# Patient Record
Sex: Female | Born: 1970 | Race: Black or African American | Hispanic: No | Marital: Married | Smoking: Never smoker
Health system: Southern US, Community
[De-identification: ages and names within clinical notes are randomized; demographics above are authoritative.]

## PROBLEM LIST (undated history)

## (undated) DIAGNOSIS — I10 Essential (primary) hypertension: Secondary | ICD-10-CM

## (undated) DIAGNOSIS — E119 Type 2 diabetes mellitus without complications: Secondary | ICD-10-CM

## (undated) DIAGNOSIS — E785 Hyperlipidemia, unspecified: Secondary | ICD-10-CM

## (undated) HISTORY — DX: Essential (primary) hypertension: I10

## (undated) HISTORY — PX: WISDOM TOOTH EXTRACTION: SHX21

## (undated) HISTORY — DX: Type 2 diabetes mellitus without complications: E11.9

## (undated) HISTORY — DX: Hyperlipidemia, unspecified: E78.5

## (undated) HISTORY — PX: HYSTEROSCOPY WITH D & C: SHX1775

---

## 1998-01-12 ENCOUNTER — Other Ambulatory Visit: Admission: RE | Admit: 1998-01-12 | Discharge: 1998-01-12 | Payer: Self-pay | Admitting: Family Medicine

## 2001-09-13 ENCOUNTER — Other Ambulatory Visit: Admission: RE | Admit: 2001-09-13 | Discharge: 2001-09-13 | Payer: Self-pay | Admitting: Obstetrics and Gynecology

## 2001-12-10 ENCOUNTER — Ambulatory Visit (HOSPITAL_COMMUNITY): Admission: RE | Admit: 2001-12-10 | Discharge: 2001-12-10 | Payer: Self-pay | Admitting: Obstetrics and Gynecology

## 2001-12-10 ENCOUNTER — Encounter: Payer: Self-pay | Admitting: Obstetrics and Gynecology

## 2002-10-24 ENCOUNTER — Ambulatory Visit (HOSPITAL_COMMUNITY): Admission: RE | Admit: 2002-10-24 | Discharge: 2002-10-24 | Payer: Self-pay | Admitting: Oncology

## 2004-05-20 ENCOUNTER — Other Ambulatory Visit: Admission: RE | Admit: 2004-05-20 | Discharge: 2004-05-20 | Payer: Self-pay | Admitting: Obstetrics and Gynecology

## 2004-11-03 ENCOUNTER — Ambulatory Visit (HOSPITAL_COMMUNITY): Admission: RE | Admit: 2004-11-03 | Discharge: 2004-11-03 | Payer: Self-pay | Admitting: Obstetrics and Gynecology

## 2005-02-08 ENCOUNTER — Emergency Department (HOSPITAL_COMMUNITY): Admission: EM | Admit: 2005-02-08 | Discharge: 2005-02-08 | Payer: Self-pay | Admitting: Family Medicine

## 2005-10-26 ENCOUNTER — Emergency Department (HOSPITAL_COMMUNITY): Admission: EM | Admit: 2005-10-26 | Discharge: 2005-10-26 | Payer: Self-pay | Admitting: Emergency Medicine

## 2007-06-04 ENCOUNTER — Inpatient Hospital Stay (HOSPITAL_COMMUNITY): Admission: AD | Admit: 2007-06-04 | Discharge: 2007-06-04 | Payer: Self-pay | Admitting: Obstetrics and Gynecology

## 2008-11-29 ENCOUNTER — Ambulatory Visit (HOSPITAL_COMMUNITY): Admission: RE | Admit: 2008-11-29 | Discharge: 2008-11-29 | Payer: Self-pay | Admitting: Obstetrics and Gynecology

## 2009-12-26 ENCOUNTER — Ambulatory Visit: Payer: Self-pay | Admitting: Obstetrics and Gynecology

## 2009-12-26 ENCOUNTER — Inpatient Hospital Stay (HOSPITAL_COMMUNITY): Admission: AD | Admit: 2009-12-26 | Discharge: 2009-12-26 | Payer: Self-pay | Admitting: Obstetrics and Gynecology

## 2010-08-07 ENCOUNTER — Encounter: Payer: Self-pay | Admitting: Obstetrics and Gynecology

## 2010-10-03 LAB — URINE MICROSCOPIC-ADD ON

## 2010-10-03 LAB — CBC
HCT: 28.5 % — ABNORMAL LOW (ref 36.0–46.0)
MCV: 62.8 fL — ABNORMAL LOW (ref 78.0–100.0)
Platelets: 326 10*3/uL (ref 150–400)

## 2010-10-03 LAB — WET PREP, GENITAL
Trich, Wet Prep: NONE SEEN
Yeast Wet Prep HPF POC: NONE SEEN

## 2010-10-03 LAB — URINALYSIS, ROUTINE W REFLEX MICROSCOPIC
Bilirubin Urine: NEGATIVE
Glucose, UA: NEGATIVE mg/dL
Nitrite: NEGATIVE
Urobilinogen, UA: 1 mg/dL (ref 0.0–1.0)

## 2010-10-03 LAB — POCT PREGNANCY, URINE: Preg Test, Ur: NEGATIVE

## 2011-04-25 LAB — CBC
HCT: 27.9 — ABNORMAL LOW
Hemoglobin: 8.7 — ABNORMAL LOW
MCHC: 31.3
MCV: 62.8 — ABNORMAL LOW
RBC: 4.44
RDW: 18.9 — ABNORMAL HIGH
WBC: 4.7

## 2011-04-25 LAB — DIFFERENTIAL
Basophils Absolute: 0.1
Basophils Relative: 3 — ABNORMAL HIGH
Eosinophils Absolute: 0.1 — ABNORMAL LOW
Eosinophils Relative: 1
Lymphocytes Relative: 40
Lymphs Abs: 1.9
Monocytes Absolute: 0.2
Neutro Abs: 2.4

## 2011-07-18 HISTORY — PX: REDUCTION MAMMAPLASTY: SUR839

## 2020-09-07 ENCOUNTER — Telehealth: Payer: Self-pay

## 2020-09-07 NOTE — Telephone Encounter (Signed)
Please advise. Chart is blank with information

## 2020-09-07 NOTE — Telephone Encounter (Signed)
Patient states her cousin Hinda Glatter sees Dr Etter Sjogren as PCP and she would like to establish care as well.   Please advise

## 2020-09-08 NOTE — Telephone Encounter (Signed)
I cant find anita neal in the system Did she spell it correctly

## 2020-10-10 NOTE — Progress Notes (Signed)
New Patient Office Visit  Subjective:  Patient ID: Ana Jennings, female    DOB: Jun 11, 1971  Age: 50 y.o. MRN: [id number]  CC:  Chief Complaint  Patient presents with  . Establish Care    HPI Ana Jennings presents to establish care.   Type 2 diabetes-diagnosed with diabetes approximately 3 years ago.  Recently she was reading some information regarding diabetes and the disease process.  Once she read what can happen in the setting of poorly controlled diabetes, she became very concerned and decided to start some lifestyle changes.  She has changed her diet and started going to the gym 3 times weekly.  She is doing intermittent fasting.  Avoids a majority of meats but does love fish and various nuts.  She is currently taking Januvia, Jardiance, and glimepiride.  She did try Metformin in the past but had difficulty tolerating it.  Despite that, she expresses the desire to retry Metformin since she has heard that it is honestly the best medication for her to be on.  She is interested in going to see a nutritionist to help her better manage her diabetes.  She has not been checking her sugars regularly but does have a glucometer available.  Elevated blood pressure-blood pressure has been elevated recently although she does not check it at home.  Notes her blood pressure has always been normal but in the last 6 to 8 months, her diet and lifestyle have changed and her blood pressure has started running higher.  She has never been treated with antihypertensives before.  Facial eczema-she has facial eczema that affects her periorbital area.  Has hydrocortisone 2.5% cream that she uses sparingly as needed.  Notes this does help and she would like a refill.  She does have an appointment with a dermatologist but due to demand, her appointment is not until 2023.  Would like a referral to see if there is any when she can see sooner in her area.  Trouble sleeping-has difficulty with sleep maintenance.  She is  able to fall asleep without difficulty but once she wakes between 2 4 AM on most nights, she is unable to get back to sleep.  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  History of IDA-has had iron deficiency anemia in the past that required IV iron infusions.  She was previously seeing an oncologist for this before her she relocated from [place].  Menopause-cannot remember her last menstrual period but it has been at least 12 months.  Wonders if some of her fatigue and difficulty sleeping is not related to menopause.  Would like to have a referral to OB/GYN for her women's health needs.  Past Medical History:  Diagnosis Date  . Diabetes mellitus without complication [facility])    Past Surgical History:  Procedure Laterality Date  . NO PAST SURGERIES      Family History  Problem Relation Age of Onset  . Diabetes Mother   . Diabetes Father     Social History   Socioeconomic History  . Marital status: Not on file    Spouse name: Not on file  . Number of children: Not on file  . Years of education: Not on file  . Highest education level: Not on file  Occupational History  . Not on file  Tobacco Use  . Smoking status: Never Smoker  .  Smokeless tobacco: Never Used  Vaping Use  . Vaping Use: Never used  Substance and Sexual Activity  . Alcohol use: Not Currently  . Drug use: Never  . Sexual activity: Yes    Birth control/protection: None  Other Topics Concern  . Not on file  Social History Narrative  . Not on file   Social Determinants of Health   Financial Resource Strain: Not on file  Food Insecurity: Not on file  Transportation Needs: Not on file  Physical Activity: Not on file  Stress: Not on file  Social Connections: Not on file  Intimate Partner Violence: Not on file    ROS Review of Systems  Constitutional: Positive for fatigue. Negative for  chills, fever and unexpected weight change.  Respiratory: Negative for cough, chest tightness and shortness of breath.   Cardiovascular: Negative for chest pain, palpitations and leg swelling.  Gastrointestinal: Negative for abdominal pain, constipation, diarrhea, nausea and vomiting.  Skin: Positive for rash.  Psychiatric/Behavioral: Positive for sleep disturbance. Negative for self-injury and suicidal ideas. The patient is not nervous/anxious.     Objective:   Today's Vitals: BP (!) 155/99   Pulse 89   Temp 98.4 F (36.9 C)   Ht 5' 5.25" (1.657 m)   Wt 191 lb 14.4 oz (87 kg)   SpO2 100%   BMI 31.69 kg/m   Physical Exam Vitals reviewed.  Constitutional:      General: She is not in acute distress.    Appearance: Normal appearance.  HENT:     Head: Normocephalic and atraumatic.  Cardiovascular:     Rate and Rhythm: Normal rate and regular rhythm.     Pulses: Normal pulses.     Heart sounds: Normal heart sounds. No murmur heard. No friction rub. No gallop.   Pulmonary:     Effort: Pulmonary effort is normal. No respiratory distress.     Breath sounds: Normal breath sounds. No wheezing.  Skin:    General: Skin is warm and dry.  Neurological:     Mental Status: She is alert and oriented to person, place, and time.  Psychiatric:        Mood and Affect: Mood normal.        Behavior: Behavior normal.        Thought Content: Thought content normal.        Judgment: Judgment normal.     Assessment & Plan:   1. Encounter to establish care Reviewed available information and discussed healthcare concerns with patient.  2. Type 2 diabetes mellitus with hyperglycemia, without long-term current use of insulin (HCC) Checking hemoglobin A1c.  Referring to nutrition and diabetes services at patient request.  Continue current regimen of Januvia, Jardiance, and glimepiride. - Lipid panel - Hemoglobin A1c - Referral to Nutrition and Diabetes Services  3. Hypertension associated  with diabetes ([place]) Checking CBC with differential and CMP.  Discussed recommendation for blood pressure management with a goal of 130/80 or less.  Would be beneficial to have her on an ACE/ARB per diabetes guidelines so would like to start valsartan.  She is hesitant to add more medications to her daily regimen so we will give her another 2 weeks of diet and exercise modifications and if still elevated, plan for starting medications. - CBC with Differential/Platelet - COMPLETE METABOLIC PANEL WITH GFR  4. Facial eczema Referring to dermatology per patient request.  Refilling hydrocortisone 2.5% twice daily as needed.  5. History of iron deficiency Checking iron panel. - Fe+TIBC+Fer  6. Trouble in sleeping Discussed options for sleep management. She is hesitant to add more medications so advised to look into guided imagery, sleep hygiene measures, and OTC/herbal options. If these are not helpful, we can certainly look at pharmaceutical options.   7. Well woman exam Referring to OBGYN per patient request. - Ambulatory referral to Obstetrics / Gynecology  8. Screening for endocrine disorder Checking TSH.  - TSH   Outpatient Encounter Medications as of 10/11/2020  Medication Sig  . [DISCONTINUED] empagliflozin (JARDIANCE) 25 MG TABS tablet Take 25 mg by mouth daily.  . [DISCONTINUED] glimepiride (AMARYL) 4 MG tablet Take 4 mg by mouth daily.  . [DISCONTINUED] sitaGLIPtin (JANUVIA) 100 MG tablet Take 1 tablet by mouth daily.  . empagliflozin (JARDIANCE) 25 MG TABS tablet Take 1 tablet (25 mg total) by mouth daily.  Marland Kitchen glimepiride (AMARYL) 4 MG tablet Take 1 tablet (4 mg total) by mouth daily.  . sitaGLIPtin (JANUVIA) 100 MG tablet Take 1 tablet (100 mg total) by mouth daily.   No facility-administered encounter medications on file as of 10/11/2020.    Follow-up: Return in about 2 weeks (around 10/25/2020) for nurse visit for BP check.   Clearnce Sorrel, DNP, APRN, FNP-BC [facility]

## 2020-10-11 ENCOUNTER — Encounter: Payer: Self-pay | Admitting: Medical-Surgical

## 2020-10-11 ENCOUNTER — Ambulatory Visit (INDEPENDENT_AMBULATORY_CARE_PROVIDER_SITE_OTHER): Payer: BC Managed Care – PPO | Admitting: Medical-Surgical

## 2020-10-11 ENCOUNTER — Other Ambulatory Visit: Payer: Self-pay

## 2020-10-11 VITALS — BP 155/99 | HR 89 | Temp 98.4°F | Ht 65.25 in | Wt 191.9 lb

## 2020-10-11 DIAGNOSIS — E1169 Type 2 diabetes mellitus with other specified complication: Secondary | ICD-10-CM

## 2020-10-11 DIAGNOSIS — L309 Dermatitis, unspecified: Secondary | ICD-10-CM

## 2020-10-11 DIAGNOSIS — Z8639 Personal history of other endocrine, nutritional and metabolic disease: Secondary | ICD-10-CM | POA: Insufficient documentation

## 2020-10-11 DIAGNOSIS — I152 Hypertension secondary to endocrine disorders: Secondary | ICD-10-CM

## 2020-10-11 DIAGNOSIS — Z7689 Persons encountering health services in other specified circumstances: Secondary | ICD-10-CM

## 2020-10-11 DIAGNOSIS — E1165 Type 2 diabetes mellitus with hyperglycemia: Secondary | ICD-10-CM | POA: Insufficient documentation

## 2020-10-11 DIAGNOSIS — G479 Sleep disorder, unspecified: Secondary | ICD-10-CM

## 2020-10-11 DIAGNOSIS — E1159 Type 2 diabetes mellitus with other circulatory complications: Secondary | ICD-10-CM

## 2020-10-11 DIAGNOSIS — Z01419 Encounter for gynecological examination (general) (routine) without abnormal findings: Secondary | ICD-10-CM

## 2020-10-11 DIAGNOSIS — E785 Hyperlipidemia, unspecified: Secondary | ICD-10-CM

## 2020-10-11 DIAGNOSIS — Z1329 Encounter for screening for other suspected endocrine disorder: Secondary | ICD-10-CM

## 2020-10-11 MED ORDER — HYDROCORTISONE 2.5 % EX CREA: TOPICAL_CREAM | Freq: Two times a day (BID) | CUTANEOUS | 0 refills | Status: DC | PRN

## 2020-10-11 MED ORDER — GLIMEPIRIDE 4 MG PO TABS: 4.0000 mg | ORAL_TABLET | Freq: Every day | ORAL | 1 refills | Status: DC

## 2020-10-11 MED ORDER — EMPAGLIFLOZIN 25 MG PO TABS: 25.0000 mg | ORAL_TABLET | Freq: Every day | ORAL | 1 refills | Status: DC

## 2020-10-11 MED ORDER — SITAGLIPTIN PHOSPHATE 100 MG PO TABS: 100.0000 mg | ORAL_TABLET | Freq: Every day | ORAL | 1 refills | Status: DC

## 2020-10-11 NOTE — Patient Instructions (Signed)

## 2020-10-12 LAB — HEMOGLOBIN A1C
Hgb A1c MFr Bld: 9.8 % of total Hgb — ABNORMAL HIGH (ref <5.7)
Mean Plasma Glucose: 235 mg/dL
eAG (mmol/L): 13 mmol/L

## 2020-10-12 LAB — COMPLETE METABOLIC PANEL WITH GFR
AG Ratio: 1.5 (calc) (ref 1.0–2.5)
ALT: 25 U/L (ref 6–29)
AST: 17 U/L (ref 10–35)
Albumin: 4.6 g/dL (ref 3.6–5.1)
Alkaline phosphatase (APISO): 68 U/L (ref 37–153)
BUN: 14 mg/dL (ref 7–25)
CO2: 26 mmol/L (ref 20–32)
Calcium: 9.4 mg/dL (ref 8.6–10.4)
Chloride: 103 mmol/L (ref 98–110)
Creat: 0.53 mg/dL (ref 0.50–1.05)
GFR, Est African American: 127 mL/min/{1.73_m2} (ref >=60)
GFR, Est Non African American: 110 mL/min/{1.73_m2} (ref >=60)
Globulin: 3 g/dL (calc) (ref 1.9–3.7)
Glucose, Bld: 233 mg/dL — ABNORMAL HIGH (ref 65–99)
Potassium: 4.2 mmol/L (ref 3.5–5.3)
Sodium: 139 mmol/L (ref 135–146)
Total Bilirubin: 0.5 mg/dL (ref 0.2–1.2)
Total Protein: 7.6 g/dL (ref 6.1–8.1)

## 2020-10-12 LAB — CBC WITH DIFFERENTIAL/PLATELET
Absolute Monocytes: 262 cells/uL (ref 200–950)
Basophils Absolute: 41 cells/uL (ref 0–200)
Basophils Relative: 1 %
Eosinophils Absolute: 70 cells/uL (ref 15–500)
Eosinophils Relative: 1.7 %
HCT: 46.1 % — ABNORMAL HIGH (ref 35.0–45.0)
Hemoglobin: 15.1 g/dL (ref 11.7–15.5)
Lymphs Abs: 1472 cells/uL (ref 850–3900)
MCH: 27.1 pg (ref 27.0–33.0)
MCHC: 32.8 g/dL (ref 32.0–36.0)
MCV: 82.6 fL (ref 80.0–100.0)
MPV: 11 fL (ref 7.5–12.5)
Monocytes Relative: 6.4 %
Neutro Abs: 2255 cells/uL (ref 1500–7800)
Neutrophils Relative %: 55 %
Platelets: 233 10*3/uL (ref 140–400)
RBC: 5.58 10*6/uL — ABNORMAL HIGH (ref 3.80–5.10)
RDW: 13.3 % (ref 11.0–15.0)
Total Lymphocyte: 35.9 %
WBC: 4.1 10*3/uL (ref 3.8–10.8)

## 2020-10-12 LAB — TSH: TSH: 0.52 mIU/L

## 2020-10-12 LAB — IRON,TIBC AND FERRITIN PANEL
%SAT: 15 % (calc) — ABNORMAL LOW (ref 16–45)
Ferritin: 68 ng/mL (ref 16–232)
Iron: 60 ug/dL (ref 45–160)
TIBC: 405 mcg/dL (calc) (ref 250–450)

## 2020-10-12 LAB — LIPID PANEL
Cholesterol: 181 mg/dL (ref <200)
HDL: 48 mg/dL — ABNORMAL LOW (ref >=50)
LDL Cholesterol (Calc): 116 mg/dL (calc) — ABNORMAL HIGH
Non-HDL Cholesterol (Calc): 133 mg/dL (calc) — ABNORMAL HIGH (ref <130)
Total CHOL/HDL Ratio: 3.8 (calc) (ref <5.0)
Triglycerides: 78 mg/dL (ref <150)

## 2020-10-12 MED ORDER — ATORVASTATIN CALCIUM 10 MG PO TABS: 10.0000 mg | ORAL_TABLET | Freq: Every day | ORAL | 3 refills | Status: DC

## 2020-10-12 NOTE — Addendum Note (Signed)
Addended bySamuel Bouche on: 10/12/2020 06:26 PM   Modules accepted: Orders

## 2020-10-12 NOTE — Addendum Note (Signed)
Addended bySamuel Bouche on: 10/12/2020 06:24 PM   Modules accepted: Orders

## 2020-10-15 ENCOUNTER — Other Ambulatory Visit: Payer: Self-pay | Admitting: Medical-Surgical

## 2020-10-15 ENCOUNTER — Encounter: Payer: Self-pay | Admitting: Medical-Surgical

## 2020-10-15 MED ORDER — HYDROCORTISONE 2.5 % EX OINT: TOPICAL_OINTMENT | CUTANEOUS | 3 refills | Status: DC

## 2020-10-19 ENCOUNTER — Emergency Department (HOSPITAL_BASED_OUTPATIENT_CLINIC_OR_DEPARTMENT_OTHER)
Admission: EM | Admit: 2020-10-19 | Discharge: 2020-10-19 | Disposition: A | Discharge status: Home / Self Care | Payer: BC Managed Care – PPO | Attending: Emergency Medicine | Admitting: Emergency Medicine

## 2020-10-19 ENCOUNTER — Other Ambulatory Visit: Payer: Self-pay

## 2020-10-19 ENCOUNTER — Encounter (HOSPITAL_BASED_OUTPATIENT_CLINIC_OR_DEPARTMENT_OTHER): Payer: Self-pay | Admitting: *Deleted

## 2020-10-19 DIAGNOSIS — Z7984 Long term (current) use of oral hypoglycemic drugs: Secondary | ICD-10-CM | POA: Diagnosis not present

## 2020-10-19 DIAGNOSIS — E119 Type 2 diabetes mellitus without complications: Secondary | ICD-10-CM | POA: Diagnosis not present

## 2020-10-19 DIAGNOSIS — Z79899 Other long term (current) drug therapy: Secondary | ICD-10-CM | POA: Diagnosis not present

## 2020-10-19 DIAGNOSIS — M7522 Bicipital tendinitis, left shoulder: Secondary | ICD-10-CM

## 2020-10-19 DIAGNOSIS — I10 Essential (primary) hypertension: Secondary | ICD-10-CM | POA: Insufficient documentation

## 2020-10-19 DIAGNOSIS — M25512 Pain in left shoulder: Secondary | ICD-10-CM | POA: Diagnosis present

## 2020-10-19 MED ORDER — NAPROXEN 375 MG PO TABS: 375.0000 mg | ORAL_TABLET | Freq: Two times a day (BID) | ORAL | 0 refills | Status: DC

## 2020-10-19 MED ORDER — METHOCARBAMOL 500 MG PO TABS: 500.0000 mg | ORAL_TABLET | Freq: Three times a day (TID) | ORAL | 0 refills | Status: DC | PRN

## 2020-10-19 NOTE — ED Triage Notes (Signed)
Left arm pain x 2 days. States she feels like she slept on it.

## 2020-10-19 NOTE — Discharge Instructions (Signed)
1.  You may take naproxen as prescribed twice daily.  You may also take extra strength Tylenol every 6 hours either alone or in combination with naproxen.  You have been prescribed Robaxin.  This is a muscle relaxer.  You may try this to see if it is helpful in combination with either naproxen or Tylenol. 2.  Initially rest the arm.  After 2 to 3 days of anti-inflammatories, start gentle movements of the arms of the shoulder does not stiffen.  Follow-up with your doctor for recheck.  You may need referral to an orthopedic specialist for shoulder pain if symptoms are persisting.

## 2020-10-19 NOTE — ED Provider Notes (Signed)
[place] EMERGENCY DEPARTMENT Provider Note   CSN: [id number] Arrival date & time: 10/19/20  1657     History Chief Complaint  Patient presents with  . Arm Pain    Ana Jennings is a 50 y.o. female.  HPI Patient reports having a lot of pain into the left shoulder and upper arm.  Is radiating down from the shoulder into the biceps area.  She reports she started googling and then got concerned about heart attack.  She reports however, after thinking about it, 2 days ago she was working on the elliptical treadmill.  She reports she was doing a really aggressive exercise and using her upper arms briskly.  She did a really successful workout and then did a second work out on the elliptical,  just to confirm how well it went.  She reports she worked up a really good sweat.  She did not experience any chest pain or unusual shortness of breath during these exercise regimens.  She reports she gets a lot of pain now if she elevates the left arm at the shoulder.  No numbness weakness or tingling into the hand.  She has not tried anything for pain yet.    Past Medical History:  Diagnosis Date  . Diabetes mellitus without complication [facility])     Patient Active Problem List   Diagnosis Date Noted  . Facial eczema 10/11/2020  . Hypertension associated with diabetes ([place]) 10/11/2020  . Type 2 diabetes mellitus with hyperglycemia, without long-term current use of insulin ([place]) 10/11/2020  . History of iron deficiency 10/11/2020  . Trouble in sleeping 10/11/2020    Past Surgical History:  Procedure Laterality Date  . NO PAST SURGERIES       OB History   No obstetric history on file.     Family History  Problem Relation Age of Onset  . Diabetes Mother   . Diabetes Father     Social History   Tobacco Use  . Smoking status: Never Smoker  . Smokeless tobacco: Never Used  Vaping Use  . Vaping Use: Never used  Substance Use Topics  . Alcohol use: Not Currently  . Drug use:  Never    Home Medications Prior to Admission medications   Medication Sig Start Date End Date Taking? Authorizing Provider  atorvastatin (LIPITOR) 10 MG tablet Take 1 tablet (10 mg total) by mouth daily. 10/12/20  Yes Samuel Bouche, NP  empagliflozin (JARDIANCE) 25 MG TABS tablet Take 1 tablet (25 mg total) by mouth daily. 10/11/20  Yes Samuel Bouche, NP  glimepiride (AMARYL) 4 MG tablet Take 1 tablet (4 mg total) by mouth daily. 10/11/20  Yes Samuel Bouche, NP  hydrocortisone 2.5 % ointment Apply to affected area BID. 10/15/20   Samuel Bouche, NP  methocarbamol (ROBAXIN) 500 MG tablet Take 1 tablet (500 mg total) by mouth every 8 (eight) hours as needed for muscle spasms. 10/19/20  Yes Charlesetta Shanks, MD  naproxen (NAPROSYN) 375 MG tablet Take 1 tablet (375 mg total) by mouth 2 (two) times daily. 10/19/20  Yes Marlo Arriola, Jeannie Done, MD  sitaGLIPtin (JANUVIA) 100 MG tablet Take 1 tablet (100 mg total) by mouth daily. 10/11/20  Yes Samuel Bouche, NP    Allergies    Metformin and related  Review of Systems   Review of Systems 10 systems reviewed negative except as per HPI Physical Exam Updated Vital Signs BP (!) 146/88   Pulse 78   Temp 98.5 F (36.9 C) (Oral)   Resp 16  Ht 5' 5.25" (1.657 m)   Wt 89.3 kg   SpO2 98%   BMI 32.51 kg/m   Physical Exam Constitutional:      Appearance: Normal appearance.     Comments: Patient is well in appearance.  No respiratory distress.  HENT:     Mouth/Throat:     Pharynx: Oropharynx is clear.  Eyes:     Extraocular Movements: Extraocular movements intact.  Cardiovascular:     Rate and Rhythm: Normal rate and regular rhythm.  Pulmonary:     Effort: Pulmonary effort is normal.     Breath sounds: Normal breath sounds.  Abdominal:     General: There is no distension.     Palpations: Abdomen is soft.  Musculoskeletal:     Comments: Pain to palpation over the anterior aspect of the shoulder slightly distally consistent with biceps tendon location.  Pain with  range of motion with abduction of the shoulder beyond 60 degrees.  Biceps muscle is intact.  Hand is warm and dry.  Radial pulse 2+ and strong.  Grip strength is normal.  Skin:    General: Skin is warm and dry.  Neurological:     General: No focal deficit present.     Mental Status: She is alert and oriented to person, place, and time.     Coordination: Coordination normal.  Psychiatric:        Mood and Affect: Mood normal.     ED Results / Procedures / Treatments   Labs (all labs ordered are listed, but only abnormal results are displayed) Labs Reviewed - No data to display  EKG None  Radiology No results found.  Procedures Procedures   Medications Ordered in ED Medications - No data to display  ED Course  I have reviewed the triage vital signs and the nursing notes.  Pertinent labs & imaging results that were available during my care of the patient were reviewed by me and considered in my medical decision making (see chart for details).    MDM Rules/Calculators/A&P                          Patient presents with shoulder and upper arm pain with concern for possible cardiac etiology.  After getting complete history, she did have a significant workout and has findings consistent with biceps tendinitis.  Pain is very reproducible.  She has no neurovascular compromise.  Patient successfully did aggressive workout with no chest pain or shortness of breath.  I have extremely low suspicion for ACS.  Patient is counseled on using naproxen and acetaminophen for pain control.  Also counseled on Robaxin for muscle relaxer. Final Clinical Impression(s) / ED Diagnoses Final diagnoses:  Biceps tendonitis on left    Rx / DC Orders ED Discharge Orders         Ordered    naproxen (NAPROSYN) 375 MG tablet  2 times daily        10/19/20 1921    methocarbamol (ROBAXIN) 500 MG tablet  Every 8 hours PRN        10/19/20 Burton Apley, MD 10/19/20 1927

## 2020-10-19 NOTE — ED Notes (Signed)
Patient complaining of left arm pain. Pain began 2 days ago. Patient believes she "overdid it" at the gym on the elliptical machine. Pain radiates from mid deltoid area down to wrist.

## 2020-10-19 NOTE — ED Triage Notes (Signed)
Emergency Medicine Provider Triage Evaluation Note  Ana Jennings , a 50 y.o. female  was evaluated in triage.  Pt complains of L arm pain for the last two days. States that she slept on her arm two nights ago and started having pain in this area. Frequently goes to the gym. No cp, sob, parerethises, no cardiac history. No trauma to the area.    Review of Systems  Positive: L arm pain Negative: Numbness, tingling, CP, SOB, palpiations.   Physical Exam  BP (!) 143/88 (BP Location: Right Arm)   Pulse 87   Temp 98.5 F (36.9 C) (Oral)   Resp 16   Ht 5' 5.25" (1.657 m)   Wt 89.3 kg   SpO2 100%   BMI 32.51 kg/m  Gen:   Awake, no distress   HEENT:  Atraumatic  Resp:  Normal effort  Cardiac:  Normal rate Abd:   Nondistended, nontender MSK:   L arm with tenderness from biceps down to forearm. NO ttp to neck or shoulder. No obvious biceps deformity. Able to range arm slowly. No swelling or erythema. Radial pulse 2+ Neuro:  Speech clear  Medical Decision Making  Medically screening exam initiated at 5:14 PM.  Appropriate orders placed.  Ana Jennings was informed that the remainder of the evaluation will be completed by another provider, this initial triage assessment does not replace that evaluation, and the importance of remaining in the ED until their evaluation is complete.  Clinical Impression  L arm pain.    Alfredia Client, PA-C 10/19/20 1722

## 2020-10-25 ENCOUNTER — Ambulatory Visit (INDEPENDENT_AMBULATORY_CARE_PROVIDER_SITE_OTHER): Payer: BC Managed Care – PPO | Admitting: Medical-Surgical

## 2020-10-25 ENCOUNTER — Other Ambulatory Visit: Payer: Self-pay

## 2020-10-25 VITALS — BP 144/95 | HR 84 | Temp 98.8°F | Resp 20 | Ht 65.25 in | Wt 190.0 lb

## 2020-10-25 DIAGNOSIS — E1159 Type 2 diabetes mellitus with other circulatory complications: Secondary | ICD-10-CM

## 2020-10-25 DIAGNOSIS — I152 Hypertension secondary to endocrine disorders: Secondary | ICD-10-CM

## 2020-10-25 MED ORDER — FLUOCINOLONE ACETONIDE 0.01 % EX SOLN: Freq: Two times a day (BID) | CUTANEOUS | 0 refills | Status: DC

## 2020-10-25 MED ORDER — AMLODIPINE BESYLATE 5 MG PO TABS: 5.0000 mg | ORAL_TABLET | Freq: Every day | ORAL | 3 refills | Status: DC

## 2020-10-25 NOTE — Progress Notes (Signed)
Established Patient Office Visit  Subjective:  Patient ID: Ana Jennings, female    DOB: [birth year]  Age: 50 y.o. MRN: [id number]  CC:  Chief Complaint  Patient presents with  . Hypertension    HPI Ana Jennings presents for a BP check. First BP 156/99, Second BP 144/95.  Past Medical History:  Diagnosis Date  . Diabetes mellitus without complication [facility])     Past Surgical History:  Procedure Laterality Date  . NO PAST SURGERIES      Family History  Problem Relation Age of Onset  . Diabetes Mother   . Diabetes Father     Social History   Socioeconomic History  . Marital status: Married    Spouse name: Not on file  . Number of children: Not on file  . Years of education: Not on file  . Highest education level: Not on file  Occupational History  . Not on file  Tobacco Use  . Smoking status: Never Smoker  . Smokeless tobacco: Never Used  Vaping Use  . Vaping Use: Never used  Substance and Sexual Activity  . Alcohol use: Not Currently  . Drug use: Never  . Sexual activity: Yes    Birth control/protection: None  Other Topics Concern  . Not on file  Social History Narrative   ** Merged History Encounter **       Social Determinants of Health   Financial Resource Strain: Not on file  Food Insecurity: Not on file  Transportation Needs: Not on file  Physical Activity: Not on file  Stress: Not on file  Social Connections: Not on file  Intimate Partner Violence: Not on file    Outpatient Medications Prior to Visit  Medication Sig Dispense Refill  . atorvastatin (LIPITOR) 10 MG tablet Take 1 tablet (10 mg total) by mouth daily. 90 tablet 3  . empagliflozin (JARDIANCE) 25 MG TABS tablet Take 1 tablet (25 mg total) by mouth daily. 90 tablet 1  . glimepiride (AMARYL) 4 MG tablet Take 1 tablet (4 mg total) by mouth daily. 90 tablet 1  . hydrocortisone 2.5 % ointment Apply to affected area BID. 30 g 3  . methocarbamol (ROBAXIN) 500 MG tablet Take 1 tablet (500 mg total)  by mouth every 8 (eight) hours as needed for muscle spasms. 20 tablet 0  . naproxen (NAPROSYN) 375 MG tablet Take 1 tablet (375 mg total) by mouth 2 (two) times daily. 20 tablet 0  . sitaGLIPtin (JANUVIA) 100 MG tablet Take 1 tablet (100 mg total) by mouth daily. 90 tablet 1   No facility-administered medications prior to visit.    Allergies  Allergen Reactions  . Metformin And Related Nausea And Vomiting and Other (See Comments)    headache    ROS Review of Systems    Objective:    Physical Exam  BP (!) 156/99 (BP Location: Right Arm, Patient Position: Sitting, Cuff Size: Large)   Pulse 84   Temp 98.8 F (37.1 C) (Temporal)   Resp 20   Ht 5' 5.25" (1.657 m)   Wt 190 lb (86.2 kg)   SpO2 100%   BMI 31.38 kg/m  Wt Readings from Last 3 Encounters:  10/25/20 190 lb (86.2 kg)  10/19/20 196 lb 13.9 oz (89.3 kg)  10/11/20 191 lb 14.4 oz (87 kg)     Health Maintenance Due  Topic Date Due  . Hepatitis C Screening  Never done  . PNEUMOCOCCAL POLYSACCHARIDE VACCINE AGE 29-64 HIGH RISK  Never done  .  FOOT EXAM  Never done  . OPHTHALMOLOGY EXAM  Never done  . URINE MICROALBUMIN  Never done  . HIV Screening  Never done  . PAP SMEAR-Modifier  Never done  . COLONOSCOPY (Pts 45-86yrs Insurance coverage will need to be confirmed)  Never done  . MAMMOGRAM  Never done    There are no preventive care reminders to display for this patient.  Lab Results  Component Value Date   TSH 0.52 10/11/2020   Lab Results  Component Value Date   WBC 4.1 10/11/2020   HGB 15.1 10/11/2020   HCT 46.1 (H) 10/11/2020   MCV 82.6 10/11/2020   PLT 233 10/11/2020   Lab Results  Component Value Date   NA 139 10/11/2020   K 4.2 10/11/2020   CO2 26 10/11/2020   GLUCOSE 233 (H) 10/11/2020   BUN 14 10/11/2020   CREATININE 0.53 10/11/2020   BILITOT 0.5 10/11/2020   AST 17 10/11/2020   ALT 25 10/11/2020   PROT 7.6 10/11/2020   CALCIUM 9.4 10/11/2020   Lab Results  Component Value Date    CHOL 181 10/11/2020   Lab Results  Component Value Date   HDL 48 (L) 10/11/2020   Lab Results  Component Value Date   LDLCALC 116 (H) 10/11/2020   Lab Results  Component Value Date   TRIG 78 10/11/2020   Lab Results  Component Value Date   CHOLHDL 3.8 10/11/2020   Lab Results  Component Value Date   HGBA1C 9.8 (H) 10/11/2020      Assessment & Plan:  Start amlodipine 5mg . Follow up in 2 weeks for a BP check.  Problem List Items Addressed This Visit      Cardiovascular and Mediastinum   Hypertension associated with diabetes ([place]) - Primary      No orders of the defined types were placed in this encounter.   Follow-up: Return in about 2 weeks (around 11/08/2020) for BP Check.    Ninfa Meeker, CMA

## 2020-11-12 ENCOUNTER — Ambulatory Visit: Payer: BC Managed Care – PPO

## 2020-11-14 LAB — HM DIABETES EYE EXAM

## 2020-11-23 ENCOUNTER — Encounter: Payer: Self-pay | Admitting: Medical-Surgical

## 2020-11-23 DIAGNOSIS — Z1211 Encounter for screening for malignant neoplasm of colon: Secondary | ICD-10-CM

## 2020-11-23 DIAGNOSIS — Z1231 Encounter for screening mammogram for malignant neoplasm of breast: Secondary | ICD-10-CM

## 2020-11-24 ENCOUNTER — Ambulatory Visit (INDEPENDENT_AMBULATORY_CARE_PROVIDER_SITE_OTHER): Payer: BC Managed Care – PPO | Admitting: Medical-Surgical

## 2020-11-24 ENCOUNTER — Other Ambulatory Visit: Payer: Self-pay

## 2020-11-24 VITALS — BP 139/77 | HR 79

## 2020-11-24 DIAGNOSIS — R300 Dysuria: Secondary | ICD-10-CM | POA: Diagnosis not present

## 2020-11-24 LAB — POCT URINALYSIS DIP (CLINITEK)
Bilirubin, UA: NEGATIVE
Glucose, UA: 500 mg/dL — AB
Ketones, POC UA: NEGATIVE mg/dL
Leukocytes, UA: NEGATIVE
Nitrite, UA: NEGATIVE
POC PROTEIN,UA: NEGATIVE
Spec Grav, UA: 1.015 (ref 1.010–1.025)
Urobilinogen, UA: 0.2 E.U./dL
pH, UA: 6.5 (ref 5.0–8.0)

## 2020-11-24 MED ORDER — FLUOCINOLONE ACETONIDE 0.01 % EX SOLN: Freq: Two times a day (BID) | CUTANEOUS | 11 refills | Status: DC

## 2020-11-24 NOTE — Progress Notes (Signed)
Ana Jennings is here for a BP check.  First reading was 152/85.  Pt states she did take her medication this morning. After sitting for 10 min her reading was lower at 139/77.  While here she complained of off and on slight dysuria so I ran a poc ua.  Results in chart.

## 2020-11-24 NOTE — Progress Notes (Signed)
Continue Amlodipine 5mg  daily. Continue low sodium diet and encourage regular exercise. Plan to follow up in 8 weeks for DM/HTN.   Clearnce Sorrel, DNP, APRN, FNP-BC [facility]

## 2020-11-29 ENCOUNTER — Ambulatory Visit (INDEPENDENT_AMBULATORY_CARE_PROVIDER_SITE_OTHER): Payer: BC Managed Care – PPO | Admitting: Family Medicine

## 2020-11-29 ENCOUNTER — Other Ambulatory Visit (HOSPITAL_COMMUNITY)
Admission: RE | Admit: 2020-11-29 | Discharge: 2020-11-29 | Disposition: A | Discharge status: Home / Self Care | Payer: BC Managed Care – PPO | Source: Ambulatory Visit | Attending: Family Medicine | Admitting: Family Medicine

## 2020-11-29 ENCOUNTER — Encounter: Payer: Self-pay | Admitting: Family Medicine

## 2020-11-29 ENCOUNTER — Other Ambulatory Visit: Payer: Self-pay

## 2020-11-29 VITALS — BP 141/87 | HR 85 | Wt 193.0 lb

## 2020-11-29 DIAGNOSIS — T148XXA Other injury of unspecified body region, initial encounter: Secondary | ICD-10-CM | POA: Insufficient documentation

## 2020-11-29 DIAGNOSIS — R4589 Other symptoms and signs involving emotional state: Secondary | ICD-10-CM | POA: Diagnosis not present

## 2020-11-29 DIAGNOSIS — N950 Postmenopausal bleeding: Secondary | ICD-10-CM | POA: Diagnosis not present

## 2020-11-29 DIAGNOSIS — Z124 Encounter for screening for malignant neoplasm of cervix: Secondary | ICD-10-CM | POA: Insufficient documentation

## 2020-11-29 DIAGNOSIS — Z01419 Encounter for gynecological examination (general) (routine) without abnormal findings: Secondary | ICD-10-CM

## 2020-11-29 MED ORDER — NAPROXEN 375 MG PO TABS: 375.0000 mg | ORAL_TABLET | Freq: Two times a day (BID) | ORAL | 0 refills | Status: DC

## 2020-11-29 NOTE — Assessment & Plan Note (Signed)
Check u/s and endometrial thickness. Will likely need endometrial sampling.

## 2020-11-29 NOTE — Progress Notes (Signed)
  Subjective:     Ana Jennings is a 50 y.o. female and is here for a comprehensive physical exam. The patient reports problems - no cycle x 1 year, then had some light spotting. Recently returned from [place] area due to husband's family. Needs pap and mammogram (scheduled) and colonoscopy   The following portions of the patient's history were reviewed and updated as appropriate: allergies, current medications, past family history, past medical history, past social history, past surgical history and problem list.  Review of Systems Pertinent items noted in HPI and remainder of comprehensive ROS otherwise negative.   Objective:    BP (!) 141/87   Pulse 85   Wt 193 lb (87.5 kg)   BMI 31.87 kg/m  General appearance: alert, cooperative and appears stated age Head: Normocephalic, without obvious abnormality, atraumatic Neck: no adenopathy, supple, symmetrical, trachea midline and thyroid not enlarged, symmetric, no tenderness/mass/nodules Lungs: clear to auscultation bilaterally Breasts: normal appearance, no masses or tenderness, well healed incisions from breast reduction Heart: regular rate and rhythm, S1, S2 normal, no murmur, click, rub or gallop Abdomen: soft, non-tender; bowel sounds normal; no masses,  no organomegaly Pelvic: cervix normal in appearance, external genitalia normal, no adnexal masses or tenderness, no cervical motion tenderness, uterus normal size, shape, and consistency and vagina normal without discharge Extremities: extremities normal, atraumatic, no cyanosis or edema and right toe with valus deformity Pulses: 2+ and symmetric Skin: Skin color, texture, turgor normal. No rashes or lesions Lymph nodes: Cervical, supraclavicular, and axillary nodes normal. Neurologic: Grossly normal    Assessment:    Healthy female exam.      Plan:   Problem List Items Addressed This Visit      Unprioritized   Postmenopausal bleeding - Primary    Check u/s and endometrial  thickness. Will likely need endometrial sampling.      Relevant Orders   US PELVIC COMPLETE WITH TRANSVAGINAL   Musculoskeletal strain    Refilled her Naprosyn--take daily with food for up to 2 weeks.      Relevant Medications   naproxen (NAPROSYN) 375 MG tablet   Feeling of sadness    Having some difficulty sleeping and having some grief around loss of things she really loved in [place]. Will refer to [facility] for some counseling and referral to counselor if needed.      Relevant Orders   Ambulatory referral to [facility]    Other Visit Diagnoses    Screening for malignant neoplasm of cervix       Relevant Orders   Cytology - PAP( [place])   Encounter for gynecological examination without abnormal finding         Has beginning of Bunion on right--given some information on braces to wear for comfort.  Return in 4 weeks (on 12/27/2020) for endometrial biopsy, see Roselyn Reef.    See After Visit Summary for Counseling Recommendations

## 2020-11-29 NOTE — Assessment & Plan Note (Signed)
Having some difficulty sleeping and having some grief around loss of things she really loved in [place]. Will refer to [facility] for some counseling and referral to counselor if needed.

## 2020-11-29 NOTE — Assessment & Plan Note (Signed)
Refilled her Naprosyn--take daily with food for up to 2 weeks.

## 2020-11-29 NOTE — Patient Instructions (Addendum)
Preventive Care 84-52 Years Old, Female Preventive care refers to lifestyle choices and visits with your health care provider that can promote health and wellness. This includes:  A yearly physical exam. This is also called an annual wellness visit.  Regular dental and eye exams.  Immunizations.  Screening for certain conditions.  Healthy lifestyle choices, such as: ? Eating a healthy diet. ? Getting regular exercise. ? Not using drugs or products that contain nicotine and tobacco. ? Limiting alcohol use. What can I expect for my preventive care visit? Physical exam Your health care provider will check your:  Height and weight. These may be used to calculate your BMI (body mass index). BMI is a measurement that tells if you are at a healthy weight.  Heart rate and blood pressure.  Body temperature.  Skin for abnormal spots. Counseling Your health care provider may ask you questions about your:  Past medical problems.  Family's medical history.  Alcohol, tobacco, and drug use.  Emotional well-being.  Home life and relationship well-being.  Sexual activity.  Diet, exercise, and sleep habits.  Work and work Statistician.  Access to firearms.  Method of birth control.  Menstrual cycle.  Pregnancy history. What immunizations do I need? Vaccines are usually given at various ages, according to a schedule. Your health care provider will recommend vaccines for you based on your age, medical history, and lifestyle or other factors, such as travel or where you work.   What tests do I need? Blood tests  Lipid and cholesterol levels. These may be checked every 5 years, or more often if you are over 3 years old.  Hepatitis C test.  Hepatitis B test. Screening  Lung cancer screening. You may have this screening every year starting at age 73 if you have a 30-pack-year history of smoking and currently smoke or have quit within the past 15 years.  Colorectal cancer  screening. ? All adults should have this screening starting at age 52 and continuing until age 17. ? Your health care provider may recommend screening at age 49 if you are at increased risk. ? You will have tests every 1-10 years, depending on your results and the type of screening test.  Diabetes screening. ? This is done by checking your blood sugar (glucose) after you have not eaten for a while (fasting). ? You may have this done every 1-3 years.  Mammogram. ? This may be done every 1-2 years. ? Talk with your health care provider about when you should start having regular mammograms. This may depend on whether you have a family history of breast cancer.  BRCA-related cancer screening. This may be done if you have a family history of breast, ovarian, tubal, or peritoneal cancers.  Pelvic exam and Pap test. ? This may be done every 3 years starting at age 10. ? Starting at age 11, this may be done every 5 years if you have a Pap test in combination with an HPV test. Other tests  STD (sexually transmitted disease) testing, if you are at risk.  Bone density scan. This is done to screen for osteoporosis. You may have this scan if you are at high risk for osteoporosis. Talk with your health care provider about your test results, treatment options, and if necessary, the need for more tests. Follow these instructions at home: Eating and drinking  Eat a diet that includes fresh fruits and vegetables, whole grains, lean protein, and low-fat dairy products.  Take vitamin and mineral supplements  as recommended by your health care provider.  Do not drink alcohol if: ? Your health care provider tells you not to drink. ? You are pregnant, may be pregnant, or are planning to become pregnant.  If you drink alcohol: ? Limit how much you have to 0-1 drink a day. ? Be aware of how much alcohol is in your drink. In the [place] one drink equals one 12 oz bottle of beer (355 mL), one 5 oz glass of  wine (148 mL), or one 1 oz glass of hard liquor (44 mL).   Lifestyle  Take daily care of your teeth and gums. Brush your teeth every morning and night with fluoride toothpaste. Floss one time each day.  Stay active. Exercise for at least 30 minutes 5 or more days each week.  Do not use any products that contain nicotine or tobacco, such as cigarettes, e-cigarettes, and chewing tobacco. If you need help quitting, ask your health care provider.  Do not use drugs.  If you are sexually active, practice safe sex. Use a condom or other form of protection to prevent STIs (sexually transmitted infections).  If you do not wish to become pregnant, use a form of birth control. If you plan to become pregnant, see your health care provider for a prepregnancy visit.  If told by your health care provider, take low-dose aspirin daily starting at age 50.  Find healthy ways to cope with stress, such as: ? Meditation, yoga, or listening to music. ? Journaling. ? Talking to a trusted person. ? Spending time with friends and family. Safety  Always wear your seat belt while driving or riding in a vehicle.  Do not drive: ? If you have been drinking alcohol. Do not ride with someone who has been drinking. ? When you are tired or distracted. ? While texting.  Wear a helmet and other protective equipment during sports activities.  If you have firearms in your house, make sure you follow all gun safety procedures. What's next?  Visit your health care provider once a year for an annual wellness visit.  Ask your health care provider how often you should have your eyes and teeth checked.  Stay up to date on all vaccines. This information is not intended to replace advice given to you by your health care provider. Make sure you discuss any questions you have with your health care provider. Document Revised: 04/06/2020 Document Reviewed: 03/14/2018 Elsevier Patient Education  2021 Elsevier Inc.  

## 2020-12-01 LAB — CYTOLOGY - PAP
Comment: NEGATIVE
Diagnosis: NEGATIVE
High risk HPV: NEGATIVE

## 2020-12-07 ENCOUNTER — Telehealth: Payer: Self-pay | Admitting: Dermatology

## 2020-12-07 NOTE — Telephone Encounter (Signed)
Phone call to patient to let her know her upcoming appointment 12/22/20 has been cancelled with Dr Denna Haggard. The patient was reminded she was dismissed from our practice in 2008 by Dr Denna Haggard. She was scheduled as a new patient through a referral. When the old paper charts were pulled to prep for the upcoming weeks, the dismissal letter was in her chart. Patient didn't seem too surprised but asked for a copy of her letter to Dr Marena Chancy and a copy of the dismissal letter be mailed to her home address that's listed in [place]. I told patient I would mail the letters out to her today and the referral coridnator would let the referring doctor know the appointment had been cancelled.

## 2020-12-11 ENCOUNTER — Encounter: Payer: Self-pay | Admitting: Medical-Surgical

## 2020-12-11 DIAGNOSIS — L309 Dermatitis, unspecified: Secondary | ICD-10-CM

## 2020-12-15 ENCOUNTER — Encounter: Payer: Self-pay | Admitting: Family Medicine

## 2020-12-15 ENCOUNTER — Other Ambulatory Visit: Payer: Self-pay

## 2020-12-15 ENCOUNTER — Other Ambulatory Visit (HOSPITAL_COMMUNITY)
Admission: RE | Admit: 2020-12-15 | Discharge: 2020-12-15 | Disposition: A | Discharge status: Home / Self Care | Payer: BC Managed Care – PPO | Source: Ambulatory Visit | Attending: Family Medicine | Admitting: Family Medicine

## 2020-12-15 ENCOUNTER — Ambulatory Visit: Payer: BC Managed Care – PPO | Admitting: Family Medicine

## 2020-12-15 VITALS — BP 133/65 | Ht 63.0 in | Wt 194.0 lb

## 2020-12-15 DIAGNOSIS — Z01812 Encounter for preprocedural laboratory examination: Secondary | ICD-10-CM | POA: Diagnosis not present

## 2020-12-15 DIAGNOSIS — N950 Postmenopausal bleeding: Secondary | ICD-10-CM

## 2020-12-15 LAB — POCT URINE PREGNANCY: Preg Test, Ur: NEGATIVE

## 2020-12-15 NOTE — Progress Notes (Signed)
Pt presents today for endometrial biopsy due to postmenopausal bleeding.

## 2020-12-15 NOTE — Assessment & Plan Note (Signed)
U/s is scheduled S/p biopsy today--await results.

## 2020-12-15 NOTE — Progress Notes (Signed)
   Subjective:    Patient ID: Ana Jennings is a 50 y.o. female presenting with Procedure (Endometrial Biopsy)  on 12/15/2020  HPI: Here with PMB and needs biopsy. No u/s as yet. She has had one of these before. Still having some bleeding.  Review of Systems  Constitutional: Negative for chills and fever.  Respiratory: Negative for shortness of breath.   Cardiovascular: Negative for chest pain.  Gastrointestinal: Negative for abdominal pain, nausea and vomiting.  Genitourinary: Positive for vaginal bleeding. Negative for dysuria.  Skin: Negative for rash.      Objective:    BP 133/65 (BP Location: Right Arm, Patient Position: Sitting, Cuff Size: Normal)   Ht 5\' 3"  (1.6 m)   Wt 194 lb (88 kg)   LMP  (LMP Unknown)   BMI 34.37 kg/m  Physical Exam Constitutional:      General: She is not in acute distress.    Appearance: She is well-developed.  HENT:     Head: Normocephalic and atraumatic.  Eyes:     General: No scleral icterus. Cardiovascular:     Rate and Rhythm: Normal rate.  Pulmonary:     Effort: Pulmonary effort is normal.  Abdominal:     Palpations: Abdomen is soft.  Genitourinary:    Comments: BUS normal, vagina is pink and rugated, cervix is nulliparous without lesion, uterus is small and anteverted, no adnexal mass or tenderness.  Musculoskeletal:     Cervical back: Neck supple.  Skin:    General: Skin is warm and dry.  Neurological:     Mental Status: She is alert and oriented to person, place, and time.     Procedure: Patient given informed consent, signed copy in the chart, time out was performed. The patient was placed in the lithotomy position and the cervix brought into view with sterile speculum.  Portio of cervix cleansed x 2 with betadine swabs.  A tenaculum was placed in the anterior lip of the cervix.  The uterus was sounded for depth of 7 cm. A pipelle was introduced to into the uterus, suction created,  and an endometrial sample was obtained. All  equipment was removed and accounted for.  The patient tolerated the procedure well.    Patient given post procedure instructions.     Assessment & Plan:   Problem List Items Addressed This Visit      Unprioritized   Postmenopausal bleeding - Primary    U/s is scheduled S/p biopsy today--await results.      Relevant Orders   POCT urine pregnancy (Completed)   Surgical pathology( [facility])      Return in about 4 weeks (around 01/12/2021).  Ana Jennings 12/15/2020 4:49 PM

## 2020-12-16 ENCOUNTER — Other Ambulatory Visit: Payer: Self-pay

## 2020-12-16 ENCOUNTER — Encounter: Payer: Self-pay | Admitting: General Practice

## 2020-12-16 DIAGNOSIS — T148XXA Other injury of unspecified body region, initial encounter: Secondary | ICD-10-CM

## 2020-12-16 MED ORDER — NAPROXEN 375 MG PO TABS: 375.0000 mg | ORAL_TABLET | Freq: Two times a day (BID) | ORAL | 0 refills | Status: DC

## 2020-12-17 LAB — SURGICAL PATHOLOGY

## 2020-12-22 ENCOUNTER — Other Ambulatory Visit: Payer: Self-pay

## 2020-12-22 ENCOUNTER — Ambulatory Visit: Payer: Self-pay | Admitting: Dermatology

## 2020-12-22 ENCOUNTER — Ambulatory Visit: Payer: BC Managed Care – PPO

## 2020-12-22 ENCOUNTER — Ambulatory Visit (INDEPENDENT_AMBULATORY_CARE_PROVIDER_SITE_OTHER): Payer: BC Managed Care – PPO

## 2020-12-22 DIAGNOSIS — Z1231 Encounter for screening mammogram for malignant neoplasm of breast: Secondary | ICD-10-CM

## 2020-12-24 MED ORDER — ONETOUCH ULTRA VI STRP: ORAL_STRIP | 12 refills | Status: DC

## 2020-12-24 NOTE — Addendum Note (Signed)
Addended by: Narda Rutherford on: 12/24/2020 07:55 AM   Modules accepted: Orders

## 2020-12-27 ENCOUNTER — Other Ambulatory Visit: Payer: Self-pay

## 2020-12-27 ENCOUNTER — Ambulatory Visit (HOSPITAL_BASED_OUTPATIENT_CLINIC_OR_DEPARTMENT_OTHER)
Admission: RE | Admit: 2020-12-27 | Discharge: 2020-12-27 | Disposition: A | Discharge status: Home / Self Care | Payer: BC Managed Care – PPO | Source: Ambulatory Visit | Attending: Family Medicine | Admitting: Family Medicine

## 2020-12-27 DIAGNOSIS — N950 Postmenopausal bleeding: Secondary | ICD-10-CM | POA: Diagnosis present

## 2020-12-30 ENCOUNTER — Other Ambulatory Visit: Payer: Self-pay | Admitting: Medical-Surgical

## 2020-12-30 DIAGNOSIS — T148XXA Other injury of unspecified body region, initial encounter: Secondary | ICD-10-CM

## 2020-12-30 MED ORDER — NAPROXEN 375 MG PO TABS
375.0000 mg | ORAL_TABLET | Freq: Two times a day (BID) | ORAL | 0 refills | Status: DC
Start: 2020-12-30 — End: 2021-04-26

## 2021-01-08 LAB — HM DIABETES EYE EXAM

## 2021-02-01 ENCOUNTER — Other Ambulatory Visit: Payer: Self-pay

## 2021-02-01 ENCOUNTER — Ambulatory Visit (AMBULATORY_SURGERY_CENTER): Payer: BC Managed Care – PPO

## 2021-02-01 VITALS — Ht 63.0 in | Wt 190.0 lb

## 2021-02-01 DIAGNOSIS — Z1211 Encounter for screening for malignant neoplasm of colon: Secondary | ICD-10-CM

## 2021-02-01 MED ORDER — CLENPIQ 10-3.5-12 MG-GM -GM/160ML PO SOLN: 1.0000 | ORAL | 0 refills | Status: DC

## 2021-02-01 NOTE — Progress Notes (Signed)
Pre visit completed via phone call; Patient verified name, DOB, and address; No egg or soy allergy known to patient  No issues with past sedation with any surgeries or procedures Patient denies ever being told they had issues or difficulty with intubation  No FH of Malignant Hyperthermia No diet pills per patient No home 02 use per patient  No blood thinners per patient  Pt reports issues with constipation -uses 3-4 stool softener daily, and increases water intake per patient No A fib or A flutter  EMMI video via MyChart  COVID 19 guidelines implemented in PV today with Pt and RN  Pt is fully vaccinated for Covid   Coupon given to pt in PV today, Code to Pharmacy and NO PA's for preps discussed with pt in PV today  Discussed with pt there will be an out-of-pocket cost for prep and that varies from $0 to 70 dollars   Due to the COVID-19 pandemic we are asking patients to follow certain guidelines.  Pt aware of COVID protocols and [facility] guidelines

## 2021-02-07 NOTE — Addendum Note (Signed)
Encounter addended by: Annie Paras on: 02/07/2021 5:31 PM  Actions taken: Letter saved

## 2021-02-15 ENCOUNTER — Other Ambulatory Visit: Payer: Self-pay

## 2021-02-15 ENCOUNTER — Ambulatory Visit (AMBULATORY_SURGERY_CENTER): Payer: BC Managed Care – PPO | Admitting: Gastroenterology

## 2021-02-15 ENCOUNTER — Encounter: Payer: Self-pay | Admitting: Gastroenterology

## 2021-02-15 VITALS — BP 126/70 | HR 60 | Temp 97.5°F | Resp 12 | Ht 63.0 in | Wt 190.0 lb

## 2021-02-15 DIAGNOSIS — D120 Benign neoplasm of cecum: Secondary | ICD-10-CM

## 2021-02-15 DIAGNOSIS — D123 Benign neoplasm of transverse colon: Secondary | ICD-10-CM | POA: Diagnosis not present

## 2021-02-15 DIAGNOSIS — K621 Rectal polyp: Secondary | ICD-10-CM

## 2021-02-15 DIAGNOSIS — Z1211 Encounter for screening for malignant neoplasm of colon: Secondary | ICD-10-CM | POA: Diagnosis present

## 2021-02-15 DIAGNOSIS — D128 Benign neoplasm of rectum: Secondary | ICD-10-CM

## 2021-02-15 MED ORDER — SODIUM CHLORIDE 0.9 % IV SOLN: 500.0000 mL | Freq: Once | INTRAVENOUS | Status: DC

## 2021-02-15 NOTE — Patient Instructions (Signed)
Handout given for polyps.  Clip card given for 2 clips to your cecum area of the colon, (Right lower abdomen)  YOU HAD AN ENDOSCOPIC PROCEDURE TODAY AT [place]:   Refer to the procedure report that was given to you for any specific questions about what was found during the examination.  If the procedure report does not answer your questions, please call your gastroenterologist to clarify.  If you requested that your care partner not be given the details of your procedure findings, then the procedure report has been included in a sealed envelope for you to review at your convenience later.  YOU SHOULD EXPECT: Some feelings of bloating in the abdomen. Passage of more gas than usual.  Walking can help get rid of the air that was put into your GI tract during the procedure and reduce the bloating. If you had a lower endoscopy (such as a colonoscopy or flexible sigmoidoscopy) you may notice spotting of blood in your stool or on the toilet paper. If you underwent a bowel prep for your procedure, you may not have a normal bowel movement for a few days.  Please Note:  You might notice some irritation and congestion in your nose or some drainage.  This is from the oxygen used during your procedure.  There is no need for concern and it should clear up in a day or so.  SYMPTOMS TO REPORT IMMEDIATELY:  Following lower endoscopy (colonoscopy or flexible sigmoidoscopy):  Excessive amounts of blood in the stool  Significant tenderness or worsening of abdominal pains  Swelling of the abdomen that is new, acute  Fever of 100F or higher  For urgent or emergent issues, a gastroenterologist can be reached at any hour by calling [phone]. Do not use MyChart messaging for urgent concerns.    DIET:  We do recommend a small meal at first, but then you may proceed to your regular diet.  Drink plenty of fluids but you should avoid alcoholic beverages for 24 hours.  ACTIVITY:  You should  plan to take it easy for the rest of today and you should NOT DRIVE or use heavy machinery until tomorrow (because of the sedation medicines used during the test).    FOLLOW UP: Our staff will call the number listed on your records 48-72 hours following your procedure to check on you and address any questions or concerns that you may have regarding the information given to you following your procedure. If we do not reach you, we will leave a message.  We will attempt to reach you two times.  During this call, we will ask if you have developed any symptoms of COVID 19. If you develop any symptoms (ie: fever, flu-like symptoms, shortness of breath, cough etc.) before then, please call [phone].  If you test positive for Covid 19 in the 2 weeks post procedure, please call and report this information to [place].    If any biopsies were taken you will be contacted by phone or by letter within the next 1-3 weeks.  Please call us at [phone] if you have not heard about the biopsies in 3 weeks.    SIGNATURES/CONFIDENTIALITY: You and/or your care partner have signed paperwork which will be entered into your electronic medical record.  These signatures attest to the fact that that the information above on your After Visit Summary has been reviewed and is understood.  Full responsibility of the confidentiality of this discharge information lies with you and/or  your care-partner.

## 2021-02-15 NOTE — Op Note (Signed)
[place] Patient Name: Ana Jennings Procedure Date: 02/15/2021 8:49 AM MRN: [id number] Endoscopist: Gerrit Heck , MD Age: 50 Referring MD:  Date of Birth: 1971/06/07 Gender: Female Account #: [id number] Procedure:                Colonoscopy Indications:              Screening for colorectal malignant neoplasm, This                            is the patient's first colonoscopy Medicines:                Monitored Anesthesia Care Procedure:                Pre-Anesthesia Assessment:                           - Prior to the procedure, a History and Physical                            was performed, and patient medications and                            allergies were reviewed. The patient's tolerance of                            previous anesthesia was also reviewed. The risks                            and benefits of the procedure and the sedation                            options and risks were discussed with the patient.                            All questions were answered, and informed consent                            was obtained. Prior Anticoagulants: The patient has                            taken no previous anticoagulant or antiplatelet                            agents. ASA Grade Assessment: II - A patient with                            mild systemic disease. After reviewing the risks                            and benefits, the patient was deemed in                            satisfactory condition to undergo the procedure.  After obtaining informed consent, the colonoscope                            was passed under direct vision. Throughout the                            procedure, the patient's blood pressure, pulse, and                            oxygen saturations were monitored continuously. The                            CF [id number] was introduced through the anus                            and advanced to the the cecum,  identified by                            appendiceal orifice and ileocecal valve. The                            colonoscopy was performed without difficulty. The                            patient tolerated the procedure well. The quality                            of the bowel preparation was good. The ileocecal                            valve, appendiceal orifice, and rectum were                            photographed. Scope In: 9:04:34 AM Scope Out: 9:32:44 AM Scope Withdrawal Time: 0 hours 23 minutes 13 seconds  Total Procedure Duration: 0 hours 28 minutes 10 seconds  Findings:                 The perianal and digital rectal examinations were                            normal.                           A 8 mm polyp was found in the cecum. The polyp was                            sessile. The polyp was removed with a cold snare.                            Resection and retrieval were complete (jar #1).                            There was some mild persistent oozing at the  polypectomy site. Based on the appearance and                            location in the proximal colon, two hemostatic                            clips were successfully placed to close the defect                            and reduce the risk of delayed bleeding. There was                            no bleeding at the end of the procedure.                           A 4 mm polyp was found in the transverse colon. The                            polyp was sessile. The polyp was removed with a                            cold snare. Resection and retrieval were complete                            (jar #1). Estimated blood loss was minimal.                           A 2 mm polyp was found in the rectum. The polyp was                            sessile. The polyp was removed with a cold snare.                            Resection and retrieval were complete (jar #2).                             Estimated blood loss was minimal.                           The retroflexed view of the distal rectum and anal                            verge was normal and showed no anal or rectal                            abnormalities. Complications:            No immediate complications. Estimated Blood Loss:     Estimated blood loss was minimal. Impression:               - One 8 mm polyp in the cecum, removed with a cold  snare. Resected and retrieved. Clips were placed.                           - One 4 mm polyp in the transverse colon, removed                            with a cold snare. Resected and retrieved.                           - One 2 mm polyp in the rectum, removed with a cold                            snare. Resected and retrieved.                           - The distal rectum and anal verge are normal on                            retroflexion view. Recommendation:           - Patient has a contact number available for                            emergencies. The signs and symptoms of potential                            delayed complications were discussed with the                            patient. Return to normal activities tomorrow.                            Written discharge instructions were provided to the                            patient.                           - Resume previous diet.                           - Continue present medications.                           - Await pathology results.                           - Repeat colonoscopy for surveillance based on                            pathology results.                           - Return to GI office PRN. Gerrit Heck, MD 02/15/2021 9:38:29 AM

## 2021-02-15 NOTE — Progress Notes (Signed)
To PACU, VSS. Report to Rn. TB

## 2021-02-15 NOTE — Progress Notes (Signed)
Called to room to assist during endoscopic procedure.  Patient ID and intended procedure confirmed with present staff. Received instructions for my participation in the procedure from the performing physician.  

## 2021-02-15 NOTE — Progress Notes (Signed)
Pt's states no medical or surgical changes since previsit or office visit. 

## 2021-02-17 ENCOUNTER — Telehealth: Payer: Self-pay | Admitting: *Deleted

## 2021-02-17 NOTE — Telephone Encounter (Signed)
  Follow up Call-  Call back number 02/15/2021  Post procedure Call Back phone  # [phone]  Permission to leave phone message Yes  Some recent data might be hidden     Patient questions:  Do you have a fever, pain , or abdominal swelling? No. Pain Score  0 *  Have you tolerated food without any problems? Yes.    Have you been able to return to your normal activities? Yes.    Do you have any questions about your discharge instructions: Diet   No. Medications  No. Follow up visit  No.  Do you have questions or concerns about your Care? No.  Actions: * If pain score is 4 or above: No action needed, pain <4.  Have you developed a fever since your procedure? no  2.   Have you had an respiratory symptoms (SOB or cough) since your procedure? no  3.   Have you tested positive for COVID 19 since your procedure no  4.   Have you had any family members/close contacts diagnosed with the COVID 19 since your procedure?  no   If yes to any of these questions please route to Joylene John, RN and Joella Prince, RN

## 2021-02-24 ENCOUNTER — Telehealth: Payer: Self-pay | Admitting: Gastroenterology

## 2021-02-24 DIAGNOSIS — D120 Benign neoplasm of cecum: Secondary | ICD-10-CM

## 2021-02-24 DIAGNOSIS — D123 Benign neoplasm of transverse colon: Secondary | ICD-10-CM

## 2021-02-24 DIAGNOSIS — D128 Benign neoplasm of rectum: Secondary | ICD-10-CM

## 2021-02-24 NOTE — Telephone Encounter (Signed)
I called the patient today to discuss pathology results from recent colonoscopy which are notable for the following: -Single 2 mm rectal hyperplastic polyp -Single subcentimeter tubular adenoma.  This was presumably the 4 mm polyp removed from the transverse colon -An additional polyp (presumably the 8 mm polyp removed from the cecum) returned as atypical lymphoid infiltrate and cannot exclude low-grade B-cell lymphoproliferative process.  We discussed results at length.  Can you please assist with the following: - CT abdomen/pelvis - Repeat colonoscopy in 3-6 months - Referral to the Hematology/Oncology service for additional evaluation - Can follow-up with me in the GI clinic after she is seen in the Hematology/oncology service

## 2021-02-25 ENCOUNTER — Encounter: Payer: Self-pay | Admitting: *Deleted

## 2021-02-25 ENCOUNTER — Other Ambulatory Visit: Payer: Self-pay

## 2021-02-25 DIAGNOSIS — E1165 Type 2 diabetes mellitus with hyperglycemia: Secondary | ICD-10-CM

## 2021-02-25 DIAGNOSIS — I152 Hypertension secondary to endocrine disorders: Secondary | ICD-10-CM

## 2021-02-25 DIAGNOSIS — E1159 Type 2 diabetes mellitus with other circulatory complications: Secondary | ICD-10-CM

## 2021-02-25 NOTE — Progress Notes (Signed)
Reached out to Ana Jennings to introduce myself as the office RN Navigator and explain our new patient process. Reviewed the reason for their referral and scheduled their new patient appointment along with labs. Provided address and directions to the office including call back phone number. Reviewed with patient any concerns they may have or any possible barriers to attending their appointment.   Informed patient about my role as a navigator and that I will meet with them prior to their New Patient appointment and more fully discuss what services I can provide. At this time patient has no further questions or needs.    Patient has a CT scan scheduled for 03/04/2021.  New patient packet including welcome letter, calendar and map mailed to patient home.   Oncology Nurse Navigator Documentation  Oncology Nurse Navigator Flowsheets 02/25/2021  Abnormal Finding Date 02/15/2021  Diagnosis Status Additional Work Up  Navigator Follow Up Date: 03/04/2021  Navigator Follow Up Reason: Scan Review  Navigator Location [facility]  Referral Date to RadOnc/MedOnc 02/25/2021  Navigator Encounter Type Introductory Phone Call  Patient Visit Type MedOnc  Treatment Phase Abnormal Labs  Barriers/Navigation Needs Coordination of Care;Education  Education Other  Interventions Coordination of Care;Education  Acuity Level 2-Minimal Needs (1-2 Barriers Identified)  Coordination of Care Appts  Education Method Verbal;Written  Time Spent with Patient 66

## 2021-02-25 NOTE — Telephone Encounter (Signed)
CT scan order in epic. Secure staff message sent to radiology schedulers to set up patient's appt. 3 month colonoscopy recall in epic. Ambulatory referral to Hematology/Oncology at [facility] per patient request.  Spoke with patient in regards to Dr. Vivia Ewing recommendations.  She is aware that she will be receiving a call to schedule her CT appt and will also receive a call from Hematology/Oncology to set up that appt. Patient is aware that she will receive her CT scan instructions when they call her to set up her appt. Patient is aware that she will need to follow up with Dr. Bryan Lemma after Hematology/Oncology consult. Answered all of patient's questions.Patient verbalized understanding and had no concerns at the end of the call.

## 2021-02-25 NOTE — Addendum Note (Signed)
Addended by: Yevette Edwards on: 02/25/2021 09:44 AM   Modules accepted: Orders

## 2021-02-25 NOTE — Addendum Note (Signed)
Addended by: Yevette Edwards on: 02/25/2021 10:10 AM   Modules accepted: Orders

## 2021-02-28 ENCOUNTER — Other Ambulatory Visit: Payer: Self-pay

## 2021-02-28 ENCOUNTER — Other Ambulatory Visit (INDEPENDENT_AMBULATORY_CARE_PROVIDER_SITE_OTHER): Payer: BC Managed Care – PPO

## 2021-02-28 DIAGNOSIS — E1159 Type 2 diabetes mellitus with other circulatory complications: Secondary | ICD-10-CM

## 2021-02-28 DIAGNOSIS — I152 Hypertension secondary to endocrine disorders: Secondary | ICD-10-CM

## 2021-02-28 DIAGNOSIS — E1165 Type 2 diabetes mellitus with hyperglycemia: Secondary | ICD-10-CM

## 2021-02-28 LAB — COMPREHENSIVE METABOLIC PANEL
ALT: 21 U/L (ref 0–35)
AST: 15 U/L (ref 0–37)
Albumin: 4.6 g/dL (ref 3.5–5.2)
Alkaline Phosphatase: 73 U/L (ref 39–117)
BUN: 10 mg/dL (ref 6–23)
CO2: 25 mEq/L (ref 19–32)
Calcium: 9.4 mg/dL (ref 8.4–10.5)
Chloride: 101 mEq/L (ref 96–112)
Creatinine, Ser: 0.61 mg/dL (ref 0.40–1.20)
GFR: 103.17 mL/min (ref >60.00)
Glucose, Bld: 175 mg/dL — ABNORMAL HIGH (ref 70–99)
Potassium: 3.6 mEq/L (ref 3.5–5.1)
Sodium: 138 mEq/L (ref 135–145)
Total Bilirubin: 0.6 mg/dL (ref 0.2–1.2)
Total Protein: 7.7 g/dL (ref 6.0–8.3)

## 2021-03-04 ENCOUNTER — Encounter (HOSPITAL_BASED_OUTPATIENT_CLINIC_OR_DEPARTMENT_OTHER): Payer: Self-pay

## 2021-03-04 ENCOUNTER — Other Ambulatory Visit: Payer: Self-pay

## 2021-03-04 ENCOUNTER — Ambulatory Visit (HOSPITAL_BASED_OUTPATIENT_CLINIC_OR_DEPARTMENT_OTHER)
Admission: RE | Admit: 2021-03-04 | Discharge: 2021-03-04 | Disposition: A | Discharge status: Home / Self Care | Payer: BC Managed Care – PPO | Source: Ambulatory Visit | Attending: Gastroenterology | Admitting: Gastroenterology

## 2021-03-04 DIAGNOSIS — D123 Benign neoplasm of transverse colon: Secondary | ICD-10-CM | POA: Insufficient documentation

## 2021-03-04 DIAGNOSIS — D120 Benign neoplasm of cecum: Secondary | ICD-10-CM | POA: Insufficient documentation

## 2021-03-04 DIAGNOSIS — D128 Benign neoplasm of rectum: Secondary | ICD-10-CM | POA: Insufficient documentation

## 2021-03-04 MED ORDER — IOHEXOL 300 MG/ML  SOLN
75.0000 mL | Freq: Once | INTRAMUSCULAR | Status: AC | PRN
  Administered 2021-03-04: 75 mL via INTRAVENOUS

## 2021-03-07 ENCOUNTER — Other Ambulatory Visit: Payer: BC Managed Care – PPO

## 2021-03-07 ENCOUNTER — Ambulatory Visit: Payer: BC Managed Care – PPO | Admitting: Hematology & Oncology

## 2021-03-08 ENCOUNTER — Encounter: Payer: Self-pay | Admitting: *Deleted

## 2021-03-08 NOTE — Progress Notes (Signed)
Oncology Nurse Navigator Documentation  Oncology Nurse Navigator Flowsheets 03/08/2021  Abnormal Finding Date -  Diagnosis Status -  Navigator Follow Up Date: 03/18/2021  Navigator Follow Up Reason: New Patient Appointment  Navigator Location [facility]  Referral Date to RadOnc/MedOnc -  Navigator Encounter Type Scan Review  Patient Visit Type MedOnc  Treatment Phase Abnormal Labs  Barriers/Navigation Needs Coordination of Care;Education  Education -  Interventions None Required  Acuity Level 2-Minimal Needs (1-2 Barriers Identified)  Coordination of Care -  Education Method -  Time Spent with Patient 15

## 2021-03-18 ENCOUNTER — Other Ambulatory Visit: Payer: Self-pay

## 2021-03-18 ENCOUNTER — Inpatient Hospital Stay (HOSPITAL_BASED_OUTPATIENT_CLINIC_OR_DEPARTMENT_OTHER): Payer: BC Managed Care – PPO | Admitting: Hematology & Oncology

## 2021-03-18 ENCOUNTER — Encounter: Payer: Self-pay | Admitting: Hematology & Oncology

## 2021-03-18 ENCOUNTER — Encounter: Payer: Self-pay | Admitting: *Deleted

## 2021-03-18 ENCOUNTER — Inpatient Hospital Stay: Payer: BC Managed Care – PPO | Attending: Hematology & Oncology

## 2021-03-18 VITALS — BP 133/73 | HR 73 | Temp 98.4°F | Resp 18 | Ht 63.0 in | Wt 194.8 lb

## 2021-03-18 DIAGNOSIS — K635 Polyp of colon: Secondary | ICD-10-CM | POA: Insufficient documentation

## 2021-03-18 DIAGNOSIS — Z808 Family history of malignant neoplasm of other organs or systems: Secondary | ICD-10-CM | POA: Diagnosis not present

## 2021-03-18 DIAGNOSIS — C8203 Follicular lymphoma grade I, intra-abdominal lymph nodes: Secondary | ICD-10-CM

## 2021-03-18 DIAGNOSIS — I10 Essential (primary) hypertension: Secondary | ICD-10-CM | POA: Diagnosis not present

## 2021-03-18 DIAGNOSIS — Z8639 Personal history of other endocrine, nutritional and metabolic disease: Secondary | ICD-10-CM

## 2021-03-18 DIAGNOSIS — E119 Type 2 diabetes mellitus without complications: Secondary | ICD-10-CM

## 2021-03-18 LAB — CMP (CANCER CENTER ONLY)
ALT: 17 U/L (ref 0–44)
AST: 16 U/L (ref 15–41)
Albumin: 4.5 g/dL (ref 3.5–5.0)
Alkaline Phosphatase: 72 U/L (ref 38–126)
Anion gap: 8 (ref 5–15)
BUN: 12 mg/dL (ref 6–20)
CO2: 29 mmol/L (ref 22–32)
Calcium: 9.5 mg/dL (ref 8.9–10.3)
Chloride: 100 mmol/L (ref 98–111)
Creatinine: 0.56 mg/dL (ref 0.44–1.00)
GFR, Estimated: >60 mL/min (ref >60)
Glucose, Bld: 175 mg/dL — ABNORMAL HIGH (ref 70–99)
Potassium: 3.6 mmol/L (ref 3.5–5.1)
Sodium: 137 mmol/L (ref 135–145)
Total Bilirubin: 0.6 mg/dL (ref 0.3–1.2)
Total Protein: 7.6 g/dL (ref 6.5–8.1)

## 2021-03-18 LAB — CBC WITH DIFFERENTIAL (CANCER CENTER ONLY)
Abs Immature Granulocytes: 0.01 10*3/uL (ref 0.00–0.07)
Basophils Absolute: 0 10*3/uL (ref 0.0–0.1)
Basophils Relative: 1 %
Eosinophils Absolute: 0.1 10*3/uL (ref 0.0–0.5)
Eosinophils Relative: 1 %
HCT: 42.8 % (ref 36.0–46.0)
Hemoglobin: 13.9 g/dL (ref 12.0–15.0)
Immature Granulocytes: 0 %
Lymphocytes Relative: 35 %
Lymphs Abs: 1.3 10*3/uL (ref 0.7–4.0)
MCH: 26.6 pg (ref 26.0–34.0)
MCHC: 32.5 g/dL (ref 30.0–36.0)
MCV: 81.8 fL (ref 80.0–100.0)
Monocytes Absolute: 0.3 10*3/uL (ref 0.1–1.0)
Monocytes Relative: 7 %
Neutro Abs: 2.1 10*3/uL (ref 1.7–7.7)
Neutrophils Relative %: 56 %
Platelet Count: 231 10*3/uL (ref 150–400)
RBC: 5.23 MIL/uL — ABNORMAL HIGH (ref 3.87–5.11)
RDW: 13.4 % (ref 11.5–15.5)
WBC Count: 3.8 10*3/uL — ABNORMAL LOW (ref 4.0–10.5)
nRBC: 0 % (ref 0.0–0.2)

## 2021-03-18 LAB — SAVE SMEAR(SSMR), FOR PROVIDER SLIDE REVIEW

## 2021-03-18 LAB — LACTATE DEHYDROGENASE: LDH: 162 U/L (ref 98–192)

## 2021-03-18 NOTE — Progress Notes (Signed)
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  HPI: Ana Jennings is a very charming 50 year old African-American female.  She is from [facility].  She moved up here recently.  Her husband's family is up here and because of their advancing age, he is helping out with the business.  She has been quite healthy although she does have diabetes.  Outside of that, she is doing okay.  She has never had a colonoscopy.  She has had her mammograms.  She has never had any change in bowel or bladder habits.  She has had no abdominal pain.  She has had no cough or shortness of breath.  She has had no issues with COVID.  She subsequently was seen by Dr. Bryan Lemma.  He went ahead and did a colonoscopy on her.  I think this was done on 02/15/2021.  He found several polyps.  But he took these out.  The pathology report [id number]) showed a tubular adenoma from the ileotransverse colon which showed a lymphoid proliferation.  There was increased number of B cells.  They were CD20 positive and CD10 positive.  There is some that were weakly CD5 positive.  The lymphoid infiltrate was atypical and a low-grade B-cell lymphoproliferative process could not be excluded.  Based on this, she then had a CT scan done.  This was done on 03/04/2021.  Everything looks fine on the CT scan.  She was kindly referred to the [facility] for additional recommendations.  She does not drink.  She has rare alcohol use.  There is no history of cancer in the family.  She has had no weight loss or weight gain.  There is no history of any type of inflammatory bowel disease.  She has had no issue with celiac disease.  Overall, I would say performance status is probably ECOG 0.   Past Medical History:  Diagnosis Date   Diabetes mellitus without complication ([place])    on meds    Hyperlipidemia    on meds   Hypertension    on meds  :   Past Surgical History:  Procedure Laterality Date   HYSTEROSCOPY WITH D & C     polyp removal x 2   REDUCTION MAMMAPLASTY Bilateral 2013   WISDOM TOOTH EXTRACTION    :   Current Outpatient Medications:    amLODipine (NORVASC) 5 MG tablet, Take 1 tablet (5 mg total) by mouth daily., Disp: 90 tablet, Rfl: 3   atorvastatin (LIPITOR) 10 MG tablet, Take 1 tablet (10 mg total) by mouth daily., Disp: 90 tablet, Rfl: 3   clobetasol ointment (TEMOVATE) [id number] %, Apply 1 application topically 2 (two) times daily., Disp: , Rfl:    empagliflozin (JARDIANCE) 25 MG TABS tablet, Take 1 tablet (25 mg total) by mouth daily., Disp: 90 tablet, Rfl: 1   Fluocinolone Acetonide Body 0.01 % OIL, Apply topically., Disp: , Rfl:    Fluocinolone Acetonide Scalp 0.01 % OIL, Apply topically., Disp: , Rfl:    glimepiride (AMARYL) 4 MG tablet, Take 1 tablet (4 mg total) by mouth daily., Disp: 90 tablet, Rfl: 1   glucose blood (ONETOUCH ULTRA) test strip, Dx DM E11.9. Check fasting blood sugar this morning and 2 hours after largest meal a couple days a week., Disp: 100 each, Rfl: 12   hydrocortisone 2.5 % ointment, Apply to  affected area BID., Disp: 30 g, Rfl: 3   ketoconazole (NIZORAL) 2 % cream, Apply topically 2 (two) times daily., Disp: , Rfl:    ketoconazole (NIZORAL) 2 % shampoo, Apply topically., Disp: , Rfl:    methocarbamol (ROBAXIN) 500 MG tablet, Take 1 tablet (500 mg total) by mouth every 8 (eight) hours as needed for muscle spasms., Disp: 20 tablet, Rfl: 0   naproxen (NAPROSYN) 375 MG tablet, Take 1 tablet (375 mg total) by mouth 2 (two) times daily., Disp: 20 tablet, Rfl: 0   sitaGLIPtin (JANUVIA) 100 MG tablet, Take 1 tablet (100 mg total) by mouth daily., Disp: 90 tablet, Rfl: 1   tacrolimus (PROTOPIC) 0.1 % ointment, Apply 1 application topically 2 (two) times daily., Disp: , Rfl: :  :   Allergies  Allergen Reactions   Metformin And  Related Nausea And Vomiting and Other (See Comments)    headache  :   Family History  Problem Relation Age of Onset   Diabetes Mother    COPD Mother    Diabetes Father    Cancer Father        pancreatic   Diabetes Maternal Grandmother    Diabetes Paternal Grandmother    Colon polyps Neg Hx    Colon cancer Neg Hx    Esophageal cancer Neg Hx    Rectal cancer Neg Hx    Stomach cancer Neg Hx   :   Social History   Socioeconomic History   Marital status: Married    Spouse name: Not on file   Number of children: Not on file   Years of education: Not on file   Highest education level: Not on file  Occupational History   Not on file  Tobacco Use   Smoking status: Never   Smokeless tobacco: Never  Vaping Use   Vaping Use: Never used  Substance and Sexual Activity   Alcohol use: Not Currently   Drug use: Never   Sexual activity: Yes    Birth control/protection: None  Other Topics Concern   Not on file  Social History Narrative   ** Merged History Encounter **       Social Determinants of Health   Financial Resource Strain: Not on file  Food Insecurity: Not on file  Transportation Needs: Not on file  Physical Activity: Not on file  Stress: Not on file  Social Connections: Not on file  Intimate Partner Violence: Not on file  :  Review of Systems  Constitutional: Negative.   HENT: Negative.    Eyes: Negative.   Respiratory: Negative.    Cardiovascular: Negative.   Gastrointestinal: Negative.   Genitourinary: Negative.   Musculoskeletal: Negative.   Skin: Negative.   Neurological: Negative.   Endo/Heme/Allergies: Negative.   Psychiatric/Behavioral: Negative.      Exam: '@IPVITALS'$ @ This is a well-developed and well-nourished African-American female in no obvious distress.  Vital signs are temperature of 98.4.  Pulse 73.  Blood pressure 133/73.  Weight is 194 pounds.  Head and neck exam shows no ocular or oral lesions.  She has no palpable cervical or  supraclavicular lymph nodes.  Lungs are clear bilaterally.  Cardiac exam regular rate and rhythm with no murmurs, rubs or bruits.  Abdomen is soft.  She has good bowel sounds.  There is no fluid wave.  There is no guarding or rebound tenderness.  There is no palpable liver or spleen tip.  Back exam shows no tenderness over the spine, ribs or hips.  Extremities  shows no clubbing, cyanosis or edema.  Skin exam shows no rashes, ecchymoses or petechia.  Neurological exam shows no focal neurological deficits.   Recent Labs    03/18/21 1056  WBC 3.8*  HGB 13.9  HCT 42.8  PLT 231    Recent Labs    03/18/21 1056  NA 137  K 3.6  CL 100  CO2 29  GLUCOSE 175*  BUN 12  CREATININE 0.56  CALCIUM 9.5    Blood smear review: None  Pathology: See above    Assessment and Plan: Ana Jennings is a very charming 50 year old African-American female.  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  Again, it is hard to say whether she actually has a low-grade lymphoma.  The pathology report certainly is not convincing for this.  She may have a B-cell lymphocytosis type of issue.  I  think it be worthwhile getting a PET scan on her.  I suspect that the PET scan probably will be negative.  I just cannot imagine that we would have to treat her right now.  I just do not see anything that is symptomatic that would need therapy.  I really believe that this is a situation that we can just follow.  We will see what the PET scan shows.  If this is negative, then I think we can probably get her back in a few months.  She may need to have another colonoscopy down the road to see if there is any changes.  She is quite nice.  We had good fellowship.  She got a prayer blanket from [place].  She was very thankful for the prayer blanket.

## 2021-03-18 NOTE — Progress Notes (Signed)
Navigator out of the office for this New Patient Appointment.  Patient visited with Dr Marin Olp. Low likelihood for active malignancy. Patient needs PET scan to confirm. Will determine continued navigation needs after PET results.   Currently no order for PET. Will follow up once order is obtained and schedule scan.   Oncology Nurse Navigator Documentation  Oncology Nurse Navigator Flowsheets 03/18/2021  Abnormal Finding Date -  Diagnosis Status -  Navigator Follow Up Date: 03/22/2021  Navigator Follow Up Reason: Appointment Review  Navigator Location [facility]  Referral Date to RadOnc/MedOnc -  Navigator Encounter Type Appt/Treatment Plan Review  Patient Visit Type MedOnc  Treatment Phase Abnormal Labs  Barriers/Navigation Needs Coordination of Care;Education  Education -  Interventions None Required  Acuity Level 2-Minimal Needs (1-2 Barriers Identified)  Coordination of Care -  Education Method -  Support Groups/Services Friends and Family  Time Spent with Patient 30

## 2021-03-18 NOTE — Addendum Note (Signed)
Addended by: Burney Gauze R on: 03/18/2021 02:16 PM   Modules accepted: Orders

## 2021-03-19 LAB — IGG, IGA, IGM
IgA: 189 mg/dL (ref 87–352)
IgG (Immunoglobin G), Serum: 1320 mg/dL (ref 586–1602)
IgM (Immunoglobulin M), Srm: 79 mg/dL (ref 26–217)

## 2021-03-22 ENCOUNTER — Encounter: Payer: Self-pay | Admitting: *Deleted

## 2021-03-22 LAB — FERRITIN: Ferritin: 46 ng/mL (ref 11–307)

## 2021-03-22 LAB — KAPPA/LAMBDA LIGHT CHAINS
Kappa free light chain: 16.2 mg/L (ref 3.3–19.4)
Kappa, lambda light chain ratio: 1.21 (ref 0.26–1.65)
Lambda free light chains: 13.4 mg/L (ref 5.7–26.3)

## 2021-03-22 LAB — IRON AND TIBC
Iron: 68 ug/dL (ref 41–142)
Saturation Ratios: 17 % — ABNORMAL LOW (ref 21–57)
TIBC: 400 ug/dL (ref 236–444)
UIBC: 332 ug/dL (ref 120–384)

## 2021-03-22 NOTE — Progress Notes (Signed)
PET Scan denied by patient's insurance. Will need a peer to peer. Dr Marin Olp is aware. Will follow for peer to peer outcome.   Oncology Nurse Navigator Documentation  Oncology Nurse Navigator Flowsheets 03/22/2021  Abnormal Finding Date -  Diagnosis Status -  Navigator Follow Up Date: 03/25/2021  Navigator Follow Up Reason: Appointment Review  Navigator Location [facility]  Referral Date to RadOnc/MedOnc -  Navigator Encounter Type Appt/Treatment Plan Review  Patient Visit Type MedOnc  Treatment Phase Abnormal Labs  Barriers/Navigation Needs Coordination of Care;Education  Education -  Interventions None Required  Acuity Level 2-Minimal Needs (1-2 Barriers Identified)  Coordination of Care -  Education Method -  Support Groups/Services Friends and Family  Time Spent with Patient 15

## 2021-03-23 ENCOUNTER — Encounter: Payer: Self-pay | Admitting: *Deleted

## 2021-03-23 LAB — PROTEIN ELECTROPHORESIS, SERUM, WITH REFLEX
A/G Ratio: 1.1 (ref 0.7–1.7)
Albumin ELP: 3.8 g/dL (ref 2.9–4.4)
Alpha-1-Globulin: 0.2 g/dL (ref 0.0–0.4)
Alpha-2-Globulin: 0.8 g/dL (ref 0.4–1.0)
Beta Globulin: 1.2 g/dL (ref 0.7–1.3)
Gamma Globulin: 1.3 g/dL (ref 0.4–1.8)
Globulin, Total: 3.5 g/dL (ref 2.2–3.9)
Total Protein ELP: 7.3 g/dL (ref 6.0–8.5)

## 2021-03-23 NOTE — Progress Notes (Signed)
PET Scan appeal approved. Patient scheduled for PET on 04/08/2021.  Unable to reach patient. Message left requesting call back.   Oncology Nurse Navigator Documentation  Oncology Nurse Navigator Flowsheets 03/23/2021  Abnormal Finding Date -  Diagnosis Status -  Navigator Follow Up Date: 04/08/2021  Navigator Follow Up Reason: Scan Review  Navigator Location [facility]  Referral Date to RadOnc/MedOnc -  Navigator Encounter Type Appt/Treatment Plan Review  Patient Visit Type MedOnc  Treatment Phase Abnormal Labs  Barriers/Navigation Needs Coordination of Care;Education  Education Other  Interventions Coordination of Care;Education  Acuity Level 2-Minimal Needs (1-2 Barriers Identified)  Coordination of Care Radiology  Education Method -  Support Groups/Services -  Time Spent with Patient 30

## 2021-03-24 ENCOUNTER — Encounter: Payer: Self-pay | Admitting: *Deleted

## 2021-03-24 NOTE — Progress Notes (Signed)
Able to make contact with patient. She is aware of PET Scan appointment including time, date and location. Reviewed the PET prep with her. Radiology Info Sheet also mailed to her home which includes all the above information.   Oncology Nurse Navigator Documentation  Oncology Nurse Navigator Flowsheets 03/24/2021  Abnormal Finding Date -  Diagnosis Status -  Navigator Follow Up Date: 04/08/2021  Navigator Follow Up Reason: Scan Review  Navigator Location [facility]  Referral Date to RadOnc/MedOnc -  Navigator Encounter Type Telephone  Telephone Appt Confirmation/Clarification;Outgoing Call  Patient Visit Type MedOnc  Treatment Phase Abnormal Labs  Barriers/Navigation Needs Coordination of Care;Education  Education Other  Interventions Coordination of Care;Education  Acuity Level 2-Minimal Needs (1-2 Barriers Identified)  Coordination of Care -  Education Method Written;Verbal;Teach-back  Support Groups/Services Friends and Family  Time Spent with Patient 15

## 2021-04-08 ENCOUNTER — Other Ambulatory Visit: Payer: Self-pay

## 2021-04-08 ENCOUNTER — Ambulatory Visit (HOSPITAL_COMMUNITY)
Admission: RE | Admit: 2021-04-08 | Discharge: 2021-04-08 | Disposition: A | Discharge status: Home / Self Care | Payer: BC Managed Care – PPO | Source: Ambulatory Visit | Attending: Hematology & Oncology | Admitting: Hematology & Oncology

## 2021-04-08 DIAGNOSIS — C8203 Follicular lymphoma grade I, intra-abdominal lymph nodes: Secondary | ICD-10-CM | POA: Insufficient documentation

## 2021-04-08 LAB — GLUCOSE, CAPILLARY: Glucose-Capillary: 229 mg/dL — ABNORMAL HIGH (ref 70–99)

## 2021-04-08 MED ORDER — FLUDEOXYGLUCOSE F - 18 (FDG) INJECTION
9.7000 | Freq: Once | INTRAVENOUS | Status: AC
  Administered 2021-04-08: 9.64 via INTRAVENOUS

## 2021-04-11 ENCOUNTER — Encounter: Payer: Self-pay | Admitting: *Deleted

## 2021-04-11 NOTE — Progress Notes (Signed)
PET is not conclusive for any malignancy. Per Dr Marin Olp will likely follow patient with serial imaging. Message sent to scheduling to bring patient in, in the next 2-3 weeks to discuss surveillance plan.   Will discontinue active navigation.   Oncology Nurse Navigator Documentation  Oncology Nurse Navigator Flowsheets 04/11/2021  Abnormal Finding Date -  Diagnosis Status -  Navigator Follow Up Date: -  Navigator Follow Up Reason: -  Navigation Complete Date: 04/11/2021  Post Navigation: Continue to Follow Patient? No  Reason Not Navigating Patient: No Treatment, Observation Only  Navigator Location [facility]  Referral Date to RadOnc/MedOnc -  Navigator Encounter Type Scan Review  Telephone -  Patient Visit Type MedOnc  Treatment Phase -  Barriers/Navigation Needs Coordination of Care  Education -  Interventions Coordination of Care  Acuity Level 2-Minimal Needs (1-2 Barriers Identified)  Coordination of Care Appts  Education Method -  Support Groups/Services -  Time Spent with Patient 15

## 2021-04-12 ENCOUNTER — Encounter: Payer: Self-pay | Admitting: *Deleted

## 2021-04-13 ENCOUNTER — Telehealth: Payer: Self-pay | Admitting: Hematology & Oncology

## 2021-04-13 NOTE — Telephone Encounter (Signed)
Scheduled appt per 9/27 sch msg . Left message for patient with appt date and time

## 2021-04-25 ENCOUNTER — Ambulatory Visit: Payer: BC Managed Care – PPO | Admitting: Medical-Surgical

## 2021-04-26 ENCOUNTER — Other Ambulatory Visit: Payer: Self-pay | Admitting: Medical-Surgical

## 2021-04-26 ENCOUNTER — Encounter: Payer: Self-pay | Admitting: Medical-Surgical

## 2021-04-26 ENCOUNTER — Ambulatory Visit: Payer: BC Managed Care – PPO | Admitting: Medical-Surgical

## 2021-04-26 VITALS — BP 142/87 | HR 75 | Resp 20 | Ht 63.0 in | Wt 194.0 lb

## 2021-04-26 DIAGNOSIS — T148XXA Other injury of unspecified body region, initial encounter: Secondary | ICD-10-CM

## 2021-04-26 DIAGNOSIS — I152 Hypertension secondary to endocrine disorders: Secondary | ICD-10-CM

## 2021-04-26 DIAGNOSIS — E1165 Type 2 diabetes mellitus with hyperglycemia: Secondary | ICD-10-CM

## 2021-04-26 DIAGNOSIS — E1159 Type 2 diabetes mellitus with other circulatory complications: Secondary | ICD-10-CM

## 2021-04-26 LAB — POCT GLYCOSYLATED HEMOGLOBIN (HGB A1C): HbA1c, POC (controlled diabetic range): 10.2 % — AB (ref 0.0–7.0)

## 2021-04-26 LAB — POCT UA - MICROALBUMIN
Albumin/Creatinine Ratio, Urine, POC: <30
Creatinine, POC: 100 mg/dL
Microalbumin Ur, POC: 30 mg/L

## 2021-04-26 MED ORDER — RYBELSUS 7 MG PO TABS: 7.0000 mg | ORAL_TABLET | Freq: Every day | ORAL | 0 refills | Status: DC

## 2021-04-26 NOTE — Progress Notes (Signed)
  HPI with pertinent ROS:   CC: DM follow up  HPI: Pleasant 50 year old female presenting today for follow up on diabetes. Last A1c 9.8% about 6 months ago. Today, returns with reports that she has been unable to find a good resource for a nutritionist to help her with her diabetic diet. She reports the one she was referred to was a money racket and would like a different referral. Describes her diet as mostly healthy and likes to eat fruit and granola. Struggles to get in protein. Feels that her dietary habits depend on her mood and admits to being an emotional eater. Not currently exercising and reports little motivation but does have a [employer] and a trainer available once she has received clearance from PT regarding her left shoulder. Has a glucometer at home but admits that she is scared of it because of what it might say. Taking Jardiance 25mg , Januvia 100mg , and Amaryl 4mg  as prescribed. Unable to tolerate Metformin due to GI upset.   HTN- taking Amlodipine 5mg  daily, tolerating well without side effects. Denies CP, SOB, palpitations, lower extremity edema, dizziness, headaches, or vision changes.  I reviewed the past medical history, family history, social history, surgical history, and allergies today and no changes were needed.  Please see the problem list section below in epic for further details.   Physical exam:   General: Well Developed, well nourished, and in no acute distress.  Neuro: Alert and oriented x3.  HEENT: Normocephalic, atraumatic.  Skin: Warm and dry. Cardiac: Regular rate and rhythm, no murmurs rubs or gallops, no lower extremity edema.  Respiratory: Clear to auscultation bilaterally. Not using accessory muscles, speaking in full sentences.  Impression and Recommendations:    1. Type 2 diabetes mellitus with hyperglycemia, without long-term current use of insulin (HCC) POCT HgbA1c 10.2% today. Urine microalbumin normal. Referring to Nutrition for  diabetic education. Unable to find a previous referral in our system so not sure who she was sent to. Patient very reluctant to consider injectable medications and there are very few options left. Continue current regimen. Add Rybelsus 3mg  daily for 30 days then plan to increase to 7mg  daily. Strongly recommend checking fasting glucose with a goal of 130 or less. Discussed recommendations for dietary modification including a focus on increasing protein, limiting simple carbs/concentrated sweets/high sugar fruits, and tracking macros for the best results. Start regular exercise and aim for weight loss.  - POCT HgB A1C - POCT UA - Microalbumin - Ambulatory referral to diabetic education  2. Hypertension associated with diabetes ([place]) Continue Amlodipine 5mg  daily. BP mildly elevated today but will plan to recheck at her next visit in a couple of weeks. If still elevated, may benefit from addition of an ACEi/ARB.  - POCT HgB A1C - POCT UA - Microalbumin  Return for as scheduled for CPE; 3 months for DM/HTN/HLD. ___________________________________________ Clearnce Sorrel, DNP, APRN, FNP-BC [facility] and [place]

## 2021-04-27 MED ORDER — NAPROXEN 375 MG PO TABS: 375.0000 mg | ORAL_TABLET | Freq: Two times a day (BID) | ORAL | 0 refills | Status: DC

## 2021-05-01 ENCOUNTER — Encounter: Payer: Self-pay | Admitting: Medical-Surgical

## 2021-05-02 MED ORDER — TRIAMCINOLONE ACETONIDE 0.1 % EX OINT: 1.0000 "application " | TOPICAL_OINTMENT | Freq: Two times a day (BID) | CUTANEOUS | 6 refills | Status: DC

## 2021-05-16 ENCOUNTER — Encounter: Payer: BC Managed Care – PPO | Admitting: Medical-Surgical

## 2021-05-17 ENCOUNTER — Inpatient Hospital Stay: Payer: BC Managed Care – PPO

## 2021-05-17 ENCOUNTER — Inpatient Hospital Stay: Payer: BC Managed Care – PPO | Admitting: Hematology & Oncology

## 2021-06-02 ENCOUNTER — Other Ambulatory Visit: Payer: Self-pay | Admitting: *Deleted

## 2021-06-02 DIAGNOSIS — Z8639 Personal history of other endocrine, nutritional and metabolic disease: Secondary | ICD-10-CM

## 2021-06-02 DIAGNOSIS — C8203 Follicular lymphoma grade I, intra-abdominal lymph nodes: Secondary | ICD-10-CM

## 2021-06-03 ENCOUNTER — Inpatient Hospital Stay (HOSPITAL_BASED_OUTPATIENT_CLINIC_OR_DEPARTMENT_OTHER): Payer: BC Managed Care – PPO | Admitting: Hematology & Oncology

## 2021-06-03 ENCOUNTER — Encounter: Payer: Self-pay | Admitting: Hematology & Oncology

## 2021-06-03 ENCOUNTER — Inpatient Hospital Stay: Payer: BC Managed Care – PPO | Attending: Hematology & Oncology

## 2021-06-03 ENCOUNTER — Other Ambulatory Visit: Payer: Self-pay

## 2021-06-03 VITALS — BP 125/93 | HR 83 | Temp 98.0°F | Resp 20 | Wt 185.0 lb

## 2021-06-03 DIAGNOSIS — E119 Type 2 diabetes mellitus without complications: Secondary | ICD-10-CM | POA: Diagnosis not present

## 2021-06-03 DIAGNOSIS — Z8639 Personal history of other endocrine, nutritional and metabolic disease: Secondary | ICD-10-CM

## 2021-06-03 DIAGNOSIS — D47Z9 Other specified neoplasms of uncertain behavior of lymphoid, hematopoietic and related tissue: Secondary | ICD-10-CM | POA: Diagnosis not present

## 2021-06-03 DIAGNOSIS — E1165 Type 2 diabetes mellitus with hyperglycemia: Secondary | ICD-10-CM

## 2021-06-03 DIAGNOSIS — C8203 Follicular lymphoma grade I, intra-abdominal lymph nodes: Secondary | ICD-10-CM

## 2021-06-03 DIAGNOSIS — K635 Polyp of colon: Secondary | ICD-10-CM | POA: Diagnosis not present

## 2021-06-03 LAB — CBC WITH DIFFERENTIAL (CANCER CENTER ONLY)
Abs Immature Granulocytes: 0.01 10*3/uL (ref 0.00–0.07)
Basophils Absolute: 0 10*3/uL (ref 0.0–0.1)
Basophils Relative: 1 %
Eosinophils Absolute: 0.1 10*3/uL (ref 0.0–0.5)
Eosinophils Relative: 1 %
HCT: 43.9 % (ref 36.0–46.0)
Hemoglobin: 14.3 g/dL (ref 12.0–15.0)
Immature Granulocytes: 0 %
Lymphocytes Relative: 33 %
Lymphs Abs: 1.3 10*3/uL (ref 0.7–4.0)
MCH: 26.8 pg (ref 26.0–34.0)
MCHC: 32.6 g/dL (ref 30.0–36.0)
MCV: 82.4 fL (ref 80.0–100.0)
Monocytes Absolute: 0.3 10*3/uL (ref 0.1–1.0)
Monocytes Relative: 8 %
Neutro Abs: 2.2 10*3/uL (ref 1.7–7.7)
Neutrophils Relative %: 57 %
Platelet Count: 202 10*3/uL (ref 150–400)
RBC: 5.33 MIL/uL — ABNORMAL HIGH (ref 3.87–5.11)
RDW: 13.3 % (ref 11.5–15.5)
WBC Count: 3.9 10*3/uL — ABNORMAL LOW (ref 4.0–10.5)
nRBC: 0 % (ref 0.0–0.2)

## 2021-06-03 LAB — CMP (CANCER CENTER ONLY)
ALT: 19 U/L (ref 0–44)
AST: 15 U/L (ref 15–41)
Albumin: 4.5 g/dL (ref 3.5–5.0)
Alkaline Phosphatase: 74 U/L (ref 38–126)
Anion gap: 8 (ref 5–15)
BUN: 11 mg/dL (ref 6–20)
CO2: 29 mmol/L (ref 22–32)
Calcium: 10.1 mg/dL (ref 8.9–10.3)
Chloride: 101 mmol/L (ref 98–111)
Creatinine: 0.6 mg/dL (ref 0.44–1.00)
GFR, Estimated: >60 mL/min (ref >60)
Glucose, Bld: 214 mg/dL — ABNORMAL HIGH (ref 70–99)
Potassium: 3.9 mmol/L (ref 3.5–5.1)
Sodium: 138 mmol/L (ref 135–145)
Total Bilirubin: 0.7 mg/dL (ref 0.3–1.2)
Total Protein: 7.5 g/dL (ref 6.5–8.1)

## 2021-06-03 LAB — IRON AND TIBC
Iron: 78 ug/dL (ref 41–142)
Saturation Ratios: 22 % (ref 21–57)
TIBC: 358 ug/dL (ref 236–444)
UIBC: 279 ug/dL (ref 120–384)

## 2021-06-03 LAB — FERRITIN: Ferritin: 76 ng/mL (ref 11–307)

## 2021-06-03 NOTE — Progress Notes (Signed)
Hematology and Oncology Follow Up Visit  Ana Jennings [id number] [birth year] 50 y.o. 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  We first saw her back in September.  After that, we did do a PET scan on her.  This was done on 04/08/2021.  This showed some slight mesenteric nodularity adjacent to where the cecal polyp was.  This measured 1.7 x 0.6 cm.  There was an SUV of 1.9.  The radiologist could not confirm or deny the possibility of a lymphoproliferative disorder.  She is feeling well.  She is worried about her diabetes.  She wants to see an endocrinologist.  She is having no problems with bowels or bladder.  She is on some stool softeners.  She has had no problems with abdominal pain..  There is been no cough or shortness of breath.  She has had no nausea or vomiting.  She has had no bleeding.  She has had no rashes.  There is been no issues with leg swelling.  Sound like she can have wonderful holiday season.  She will be going up to [facility] for Thanksgiving and down to [place] for Christmas.  She has had no fever.  Overall, I would say her performance status is ECOG 0.  Medications:  Current Outpatient Medications:    amLODipine (NORVASC) 5 MG tablet, Take 1 tablet (5 mg total) by mouth daily., Disp: 90 tablet, Rfl: 3   clobetasol ointment (TEMOVATE) 0.27 %, Apply 1 application topically 2 (two) times daily., Disp: , Rfl:    docusate sodium (COLACE) 100 MG capsule, Take 100 mg by mouth 4 (four) times daily as needed for mild constipation., Disp: , Rfl:    empagliflozin (JARDIANCE) 25 MG TABS tablet, Take 1 tablet (25 mg total) by mouth daily., Disp: 90 tablet, Rfl: 1   Fluocinolone Acetonide Body 0.01 % OIL, Apply topically., Disp: , Rfl:    Fluocinolone Acetonide Scalp 0.01 % OIL, Apply topically., Disp: ,  Rfl:    glimepiride (AMARYL) 4 MG tablet, Take 1 tablet (4 mg total) by mouth daily., Disp: 90 tablet, Rfl: 1   glucose blood (ONETOUCH ULTRA) test strip, Dx DM E11.9. Check fasting blood sugar this morning and 2 hours after largest meal a couple days a week., Disp: 100 each, Rfl: 12   hydrocortisone 2.5 % ointment, Apply to affected area BID., Disp: 30 g, Rfl: 3   ketoconazole (NIZORAL) 2 % cream, Apply topically 2 (two) times daily., Disp: , Rfl:    ketoconazole (NIZORAL) 2 % shampoo, Apply topically., Disp: , Rfl:    sitaGLIPtin (JANUVIA) 100 MG tablet, Take 1 tablet (100 mg total) by mouth daily., Disp: 90 tablet, Rfl: 1   triamcinolone ointment (KENALOG) 0.1 %, Apply 1 application topically 2 (two) times daily. To affected areas, Disp: 60 g, Rfl: 6   atorvastatin (LIPITOR) 10 MG tablet, Take 1 tablet (10 mg total) by mouth daily. (Patient not taking: Reported on 04/26/2021), Disp: 90 tablet, Rfl: 3   methocarbamol (ROBAXIN) 500 MG tablet, Take 1 tablet (500 mg total) by mouth every 8 (eight) hours as needed for muscle spasms. (Patient not taking: Reported on 06/03/2021), Disp: 20 tablet, Rfl: 0   naproxen (NAPROSYN) 375 MG tablet, Take 1 tablet (375 mg total) by mouth 2 (two) times daily. (Patient not taking: Reported on 06/03/2021), Disp: 20  tablet, Rfl: 0   Semaglutide (RYBELSUS) 7 MG TABS, Take 7 mg by mouth daily. (Patient not taking: Reported on 06/03/2021), Disp: 30 tablet, Rfl: 0   tacrolimus (PROTOPIC) 0.1 % ointment, Apply 1 application topically 2 (two) times daily. (Patient not taking: Reported on 06/03/2021), Disp: , Rfl:   Allergies:  Allergies  Allergen Reactions   Metformin And Related Nausea And Vomiting and Other (See Comments)    headache    Past Medical History, Surgical history, Social history, and Family History were reviewed and updated.  Review of Systems: Review of Systems  Constitutional: Negative.   HENT:  Negative.    Eyes: Negative.   Respiratory:  Negative.    Cardiovascular: Negative.   Gastrointestinal: Negative.   Endocrine: Negative.   Genitourinary: Negative.    Musculoskeletal: Negative.   Skin: Negative.   Neurological: Negative.   Hematological: Negative.   Psychiatric/Behavioral: Negative.     Physical Exam:  weight is 185 lb (83.9 kg). Her oral temperature is 98 F (36.7 C). Her blood pressure is 125/93 (abnormal) and her pulse is 83. Her respiration is 20 and oxygen saturation is 100%.   Wt Readings from Last 3 Encounters:  06/03/21 185 lb (83.9 kg)  04/26/21 194 lb (88 kg)  03/18/21 194 lb 12.8 oz (88.4 kg)    Physical Exam Vitals reviewed.  HENT:     Head: Normocephalic and atraumatic.  Eyes:     Pupils: Pupils are equal, round, and reactive to light.  Cardiovascular:     Rate and Rhythm: Normal rate and regular rhythm.     Heart sounds: Normal heart sounds.  Pulmonary:     Effort: Pulmonary effort is normal.     Breath sounds: Normal breath sounds.  Abdominal:     General: Bowel sounds are normal.     Palpations: Abdomen is soft.  Musculoskeletal:        General: No tenderness or deformity. Normal range of motion.     Cervical back: Normal range of motion.  Lymphadenopathy:     Cervical: No cervical adenopathy.  Skin:    General: Skin is warm and dry.     Findings: No erythema or rash.  Neurological:     Mental Status: She is alert and oriented to person, place, and time.  Psychiatric:        Behavior: Behavior normal.        Thought Content: Thought content normal.        Judgment: Judgment normal.     Lab Results  Component Value Date   WBC 3.9 (L) 06/03/2021   HGB 14.3 06/03/2021   HCT 43.9 06/03/2021   MCV 82.4 06/03/2021   PLT 202 06/03/2021     Chemistry      Component Value Date/Time   NA 138 06/03/2021 0806   K 3.9 06/03/2021 0806   CL 101 06/03/2021 0806   CO2 29 06/03/2021 0806   BUN 11 06/03/2021 0806   CREATININE 0.60 06/03/2021 0806   CREATININE 0.53 10/11/2020  0957      Component Value Date/Time   CALCIUM 10.1 06/03/2021 0806   ALKPHOS 74 06/03/2021 0806   AST 15 06/03/2021 0806   ALT 19 06/03/2021 0806   BILITOT 0.7 06/03/2021 0806      Impression and Plan: Ana Jennings is a very nice 50 year old African-American female  She has this unusual situation.  It is hard to say if there is a true B-cell lymphoproliferative disorder.  I think time will  tell whether or not this is going be a problem.  I would repeat the PET scan in another month.  This will be 3 months since her last PET scan.  I think we see increasing activity, then we might have 1 to think about radiation to this area.  Since this is appears to be a very localized process, I just would hate to put her through any kind of systemic therapy.  I would think that radiation would be quite effective.  She is aware about the diabetes.  We will have to see about making a referral for her.  I would like to see her back in March.  We will get her back sooner if there is something on the PET scan that is different.   Volanda Napoleon, MD 11/18/20229:12 AM

## 2021-06-03 NOTE — Addendum Note (Signed)
Addended by: Burney Gauze R on: 06/03/2021 02:41 PM   Modules accepted: Orders

## 2021-06-06 ENCOUNTER — Other Ambulatory Visit: Payer: Self-pay | Admitting: Medical-Surgical

## 2021-06-06 ENCOUNTER — Encounter: Payer: Self-pay | Admitting: Medical-Surgical

## 2021-06-06 DIAGNOSIS — E1165 Type 2 diabetes mellitus with hyperglycemia: Secondary | ICD-10-CM

## 2021-06-07 MED ORDER — EMPAGLIFLOZIN 25 MG PO TABS: 25.0000 mg | ORAL_TABLET | Freq: Every day | ORAL | 1 refills | Status: DC

## 2021-06-07 MED ORDER — SITAGLIPTIN PHOSPHATE 100 MG PO TABS: 100.0000 mg | ORAL_TABLET | Freq: Every day | ORAL | 1 refills | Status: DC

## 2021-06-07 NOTE — Telephone Encounter (Signed)
I sent the refills to her pharmacy. Please address her request for referral and authorize if appropriate.  I have pended the referral.  Charyl Bigger, CMA

## 2021-06-13 ENCOUNTER — Telehealth: Payer: Self-pay | Admitting: *Deleted

## 2021-06-13 NOTE — Telephone Encounter (Signed)
PET approved through P2P process. Authorization #[id number]

## 2021-06-21 ENCOUNTER — Ambulatory Visit: Payer: BC Managed Care – PPO | Admitting: Registered"

## 2021-07-08 ENCOUNTER — Other Ambulatory Visit: Payer: Self-pay

## 2021-07-08 ENCOUNTER — Ambulatory Visit (HOSPITAL_COMMUNITY)
Admission: RE | Admit: 2021-07-08 | Discharge: 2021-07-08 | Disposition: A | Discharge status: Home / Self Care | Payer: BC Managed Care – PPO | Source: Ambulatory Visit | Attending: Hematology & Oncology | Admitting: Hematology & Oncology

## 2021-07-08 DIAGNOSIS — D47Z9 Other specified neoplasms of uncertain behavior of lymphoid, hematopoietic and related tissue: Secondary | ICD-10-CM | POA: Diagnosis present

## 2021-07-08 LAB — GLUCOSE, CAPILLARY: Glucose-Capillary: 225 mg/dL — ABNORMAL HIGH (ref 70–99)

## 2021-07-08 MED ORDER — FLUDEOXYGLUCOSE F - 18 (FDG) INJECTION
9.1700 | Freq: Once | INTRAVENOUS | Status: AC | PRN
  Administered 2021-07-08: 09:00:00 9.17 via INTRAVENOUS

## 2021-07-13 ENCOUNTER — Encounter: Payer: Self-pay | Admitting: *Deleted

## 2021-07-28 DIAGNOSIS — L219 Seborrheic dermatitis, unspecified: Secondary | ICD-10-CM | POA: Insufficient documentation

## 2021-07-28 DIAGNOSIS — L409 Psoriasis, unspecified: Secondary | ICD-10-CM | POA: Insufficient documentation

## 2021-08-10 ENCOUNTER — Encounter: Payer: Self-pay | Admitting: Medical-Surgical

## 2021-08-10 DIAGNOSIS — E1165 Type 2 diabetes mellitus with hyperglycemia: Secondary | ICD-10-CM

## 2021-09-14 ENCOUNTER — Encounter: Payer: Self-pay | Admitting: Gastroenterology

## 2021-09-26 ENCOUNTER — Inpatient Hospital Stay: Payer: BC Managed Care – PPO

## 2021-09-26 ENCOUNTER — Inpatient Hospital Stay: Payer: BC Managed Care – PPO | Admitting: Hematology & Oncology

## 2021-10-25 NOTE — Progress Notes (Signed)
?Name: Ana Jennings  ?MRN/ DOB: [id number], 01/10/1971   ?Age/ Sex: 51 y.o., female   ? ?PCP: Samuel Bouche, NP   ?Reason for Endocrinology Evaluation: Type 2 Diabetes Mellitus  ?   ?Date of Initial Endocrinology Visit: 10/26/2021   ? ? ?PATIENT IDENTIFIER: Ana Jennings is a 51 y.o. female with a past medical history of T2DM, HTN and Dyslipidemia . The patient presented for initial endocrinology clinic visit on 10/26/2021 for consultative assistance with her diabetes management.  ? ? ?HPI: ?Ana Jennings was  ? ? ?Diagnosed with DM ~ 5 yrs ago  ?Prior Medications tried/Intolerance: Intolerant to Metformin  ?Currently checking blood sugars sporadically   ?Hypoglycemia episodes : no             ?Hemoglobin A1c has ranged from 10.2% in 2022, peaking at 10.2% in 2022. ?Patient required assistance for hypoglycemia: no ?Patient has required hospitalization within the last 1 year from hyper or hypoglycemia: no ? ?In terms of diet, the patient 2 meals a day, snacks .  ? ?She avoids sugar- sweetened beverages  ? ? ? ?HOME DIABETES REGIMEN: ?Jardiance 25 mg daily  ?Glimepiride 4 mg daily  ?Rybelsus 7 mg daily - not taking  ?Januvia 100 mg daily  ? ? ? ? ?Statin: yes ?ACE-I/ARB: no ? ? ? ?METER DOWNLOAD SUMMARY: Does not check  ? ? ?DIABETIC COMPLICATIONS: ?Microvascular complications:  ? ?Denies: CKD, retinopathy, neuropathy ?Last eye exam: Completed 2022 ? ?Macrovascular complications:  ? ?Denies: CAD, PVD, CVA ? ? ?PAST HISTORY: ?Past Medical History:  ?Past Medical History:  ?Diagnosis Date  ? Diabetes mellitus without complication ([place])   ? on meds  ? Hyperlipidemia   ? on meds  ? Hypertension   ? on meds  ? ?Past Surgical History:  ?Past Surgical History:  ?Procedure Laterality Date  ? HYSTEROSCOPY WITH D & C    ? polyp removal x 2  ? REDUCTION MAMMAPLASTY Bilateral 2013  ? WISDOM TOOTH EXTRACTION    ?  ?Social History:  reports that she has never smoked. She has never used smokeless tobacco. She reports that she does not currently use  alcohol. She reports that she does not use drugs. ?Family History:  ?Family History  ?Problem Relation Age of Onset  ? Diabetes Mother   ? COPD Mother   ? Diabetes Father   ? Cancer Father   ?     pancreatic  ? Diabetes Maternal Grandmother   ? Diabetes Paternal Grandmother   ? Colon polyps Neg Hx   ? Colon cancer Neg Hx   ? Esophageal cancer Neg Hx   ? Rectal cancer Neg Hx   ? Stomach cancer Neg Hx   ? ? ? ?HOME MEDICATIONS: ?Allergies as of 10/26/2021   ?No Active Allergies ?  ? ?  ?Medication List  ?  ? ?  ? Accurate as of October 26, 2021  9:25 AM. If you have any questions, ask your nurse or doctor.  ?  ?  ? ?  ? ?STOP taking these medications   ? ?Rybelsus 7 MG Tabs ?Generic drug: Semaglutide ?Stopped by: Dorita Sciara, MD ?  ? ?  ? ?TAKE these medications   ? ?amLODipine 5 MG tablet ?Commonly known as: NORVASC ?Take 1 tablet (5 mg total) by mouth daily. ?  ?atorvastatin 10 MG tablet ?Commonly known as: LIPITOR ?Take 1 tablet (10 mg total) by mouth daily. ?  ?clobetasol ointment 0.05 % ?Commonly known as: TEMOVATE ?Apply 1  application topically 2 (two) times daily. ?  ?docusate sodium 100 MG capsule ?Commonly known as: COLACE ?Take 100 mg by mouth 4 (four) times daily as needed for mild constipation. ?  ?empagliflozin 25 MG Tabs tablet ?Commonly known as: JARDIANCE ?Take 1 tablet (25 mg total) by mouth daily. ?  ?Fluocinolone Acetonide Scalp 0.01 % Oil ?Apply topically. ?  ?Fluocinolone Acetonide Body 0.01 % Oil ?Apply topically. ?  ?glimepiride 4 MG tablet ?Commonly known as: AMARYL ?Take 1 tablet (4 mg total) by mouth daily. ?  ?hydrocortisone 2.5 % ointment ?Apply to affected area BID. ?  ?ketoconazole 2 % cream ?Commonly known as: NIZORAL ?Apply topically 2 (two) times daily. ?  ?ketoconazole 2 % shampoo ?Commonly known as: NIZORAL ?Apply topically. ?  ?methocarbamol 500 MG tablet ?Commonly known as: ROBAXIN ?Take 1 tablet (500 mg total) by mouth every 8 (eight) hours as needed for muscle spasms. ?   ?naproxen 375 MG tablet ?Commonly known as: NAPROSYN ?Take 1 tablet (375 mg total) by mouth 2 (two) times daily. ?  ?OneTouch Ultra test strip ?Generic drug: glucose blood ?Dx DM E11.9. Check fasting blood sugar this morning and 2 hours after largest meal a couple days a week. ?  ?sitaGLIPtin 100 MG tablet ?Commonly known as: JANUVIA ?Take 1 tablet (100 mg total) by mouth daily. ?  ?tacrolimus 0.1 % ointment ?Commonly known as: PROTOPIC ?Apply 1 application. topically 2 (two) times daily. ?  ?triamcinolone ointment 0.1 % ?Commonly known as: KENALOG ?Apply 1 application topically 2 (two) times daily. To affected areas ?  ? ?  ? ? ? ?ALLERGIES: ?No Active Allergies ? ? ? ?REVIEW OF SYSTEMS: ?A comprehensive ROS was conducted with the patient and is negative except as per HPI  ? ?  ?OBJECTIVE:  ? ?VITAL SIGNS: BP 122/78 (BP Location: Left Arm, Patient Position: Sitting, Cuff Size: Large)   Pulse 90   Ht '5\' 3"'$  (1.6 m)   Wt 198 lb 3.2 oz (89.9 kg)   SpO2 99%   BMI 35.11 kg/m?   ? ?PHYSICAL EXAM:  ?General: Pt appears well and is in NAD  ?Neck: General: Supple without adenopathy or carotid bruits. ?Thyroid: Thyroid size normal.  No goiter or nodules appreciated.   ?Lungs: Clear with good BS bilat with no rales, rhonchi, or wheezes  ?Heart: RRR with normal S1 and S2 and no gallops; no murmurs; no rub  ?Abdomen: Normoactive bowel sounds, soft, nontender, without masses or organomegaly palpable  ?Extremities:  ?Lower extremities - No pretibial edema. No lesions.  ?Neuro: MS is good with appropriate affect, pt is alert and Ox3  ? ? ?DM foot exam: 10/26/2021 ? ?The skin of the feet is intact without sores or ulcerations. ?The pedal pulses are 2+ on right and 2+ on left. ?The sensation is intact to a screening 5.07, 10 gram monofilament bilaterally ? ? ? ?DATA REVIEWED: ? ?Lab Results  ?Component Value Date  ? HGBA1C 10.6 (A) 10/26/2021  ? HGBA1C 10.2 (A) 04/26/2021  ? HGBA1C 9.8 (H) 10/11/2020  ? ?Lab Results  ?Component  Value Date  ? MICROALBUR 30 04/26/2021  ? LDLCALC 116 (H) 10/11/2020  ? CREATININE 0.60 06/03/2021  ? ?Lab Results  ?Component Value Date  ? MICRALBCREAT <30 04/26/2021  ? ? ?Lab Results  ?Component Value Date  ? CHOL 181 10/11/2020  ? HDL 48 (L) 10/11/2020  ? LDLCALC 116 (H) 10/11/2020  ? TRIG 78 10/11/2020  ? CHOLHDL 3.8 10/11/2020  ?     ? ?  ASSESSMENT / PLAN / RECOMMENDATIONS:  ? ?1) Type 2 Diabetes Mellitus, Poorly controlled, Without complications - Most recent A1c of 10.6 %. Goal A1c < 7.0 %.   ? ?Plan: ?GENERAL: ?I have discussed with the patient the pathophysiology of diabetes. We went over the natural progression of the disease. We talked about both insulin resistance and insulin deficiency. We stressed the importance of lifestyle changes including diet and exercise. I explained the complications associated with diabetes including retinopathy, nephropathy, neuropathy as well as increased risk of cardiovascular disease. We went over the benefit seen with glycemic control.  ? ?I explained to the patient that diabetic patients are at higher than normal risk for amputations.  ?CDE/RD's have been cost prohibitive ?She is willing to try Metformin again, she tried it once in the past while fasting and developed severe nausea and vomiting.  We will prescribe metformin 500 XR and titrate gradually to twice daily ?We also discussed that eventually we will also switch Januvia to Rybelsus, we are just not going to do that at this time because she is already starting metformin ?She does not like to check glucose at home as she does not want to see high readings in the morning ?I did explain to the patient the ADA recommendation with starting insulin with an A1c of >10.0%, but the patient would like to focus on lifestyle changes at this time ? ?MEDICATIONS: ?Stop glimepiride ?Start glipizide 5 mg, 1 tablet twice daily ?Start metformin 500 mg XR twice daily ?Continue Jardiance 25 mg daily ?Continue Januvia 100 mg  daily ? ?EDUCATION / INSTRUCTIONS: ?BG monitoring instructions: Patient is instructed to check her blood sugars 1 times a day, fasting. ?Call [facility] if: BG persistently < 70 1 ?I reviewed the Rul

## 2021-10-26 ENCOUNTER — Encounter: Payer: Self-pay | Admitting: Internal Medicine

## 2021-10-26 ENCOUNTER — Ambulatory Visit: Payer: BC Managed Care – PPO | Admitting: Internal Medicine

## 2021-10-26 VITALS — BP 122/78 | HR 90 | Ht 63.0 in | Wt 198.2 lb

## 2021-10-26 DIAGNOSIS — R739 Hyperglycemia, unspecified: Secondary | ICD-10-CM | POA: Diagnosis not present

## 2021-10-26 DIAGNOSIS — E785 Hyperlipidemia, unspecified: Secondary | ICD-10-CM | POA: Insufficient documentation

## 2021-10-26 DIAGNOSIS — E1165 Type 2 diabetes mellitus with hyperglycemia: Secondary | ICD-10-CM | POA: Diagnosis not present

## 2021-10-26 LAB — POCT GLUCOSE (DEVICE FOR HOME USE): Glucose Fasting, POC: 218 mg/dL — AB (ref 70–99)

## 2021-10-26 LAB — POCT GLYCOSYLATED HEMOGLOBIN (HGB A1C): Hemoglobin A1C: 10.6 % — AB (ref 4.0–5.6)

## 2021-10-26 MED ORDER — GLIPIZIDE 5 MG PO TABS: 5.0000 mg | ORAL_TABLET | Freq: Two times a day (BID) | ORAL | 2 refills | Status: DC

## 2021-10-26 MED ORDER — METFORMIN HCL ER 500 MG PO TB24: 500.0000 mg | ORAL_TABLET | Freq: Two times a day (BID) | ORAL | 2 refills | Status: DC

## 2021-10-26 MED ORDER — SITAGLIPTIN PHOSPHATE 100 MG PO TABS: 100.0000 mg | ORAL_TABLET | Freq: Every day | ORAL | 1 refills | Status: DC

## 2021-10-26 MED ORDER — EMPAGLIFLOZIN 25 MG PO TABS: 25.0000 mg | ORAL_TABLET | Freq: Every day | ORAL | 3 refills | Status: DC

## 2021-10-26 NOTE — Patient Instructions (Addendum)
-   STOP Glimepiride  ?- Start Glipizide 5 mg , 1 tablet before first and last meal of the day  ?- Start Metformin 500 mg XR , 1 tablet with Breakfast for a week, if no side effects, increase to 1 tablet before breakfast and 1 tablet before supper  ?- Continue Jardiance 25 mg daily  ?- Continue Januvia 100 mg daily  ? ? ? ? ?HOW TO TREAT LOW BLOOD SUGARS (Blood sugar LESS THAN 70 MG/DL) ?Please follow the RULE OF 15 for the treatment of hypoglycemia treatment (when your (blood sugars are less than 70 mg/dL)  ? ?STEP 1: Take 15 grams of carbohydrates when your blood sugar is low, which includes:  ?3-4 GLUCOSE TABS  OR ?3-4 OZ OF JUICE OR REGULAR SODA OR ?ONE TUBE OF GLUCOSE GEL   ? ?STEP 2: RECHECK blood sugar in 15 MINUTES ?STEP 3: If your blood sugar is still low at the 15 minute recheck --> then, go back to STEP 1 and treat AGAIN with another 15 grams of carbohydrates. ? ?

## 2021-10-28 ENCOUNTER — Encounter: Payer: Self-pay | Admitting: Medical-Surgical

## 2021-10-28 NOTE — Telephone Encounter (Signed)
Referral already in the system, please send to provider requested per patient. Thanks! ?

## 2021-11-11 ENCOUNTER — Inpatient Hospital Stay: Payer: BC Managed Care – PPO | Attending: Hematology & Oncology

## 2021-11-11 ENCOUNTER — Inpatient Hospital Stay: Payer: BC Managed Care – PPO | Admitting: Hematology & Oncology

## 2021-11-14 ENCOUNTER — Encounter: Payer: Self-pay | Admitting: Medical-Surgical

## 2021-11-22 ENCOUNTER — Encounter: Payer: Self-pay | Admitting: Medical-Surgical

## 2021-11-22 ENCOUNTER — Ambulatory Visit: Payer: BC Managed Care – PPO | Admitting: Medical-Surgical

## 2021-11-22 VITALS — BP 126/84 | HR 75 | Resp 20 | Ht 63.0 in | Wt 197.8 lb

## 2021-11-22 DIAGNOSIS — E1165 Type 2 diabetes mellitus with hyperglycemia: Secondary | ICD-10-CM

## 2021-11-22 DIAGNOSIS — R5383 Other fatigue: Secondary | ICD-10-CM

## 2021-11-22 MED ORDER — RYBELSUS 3 MG PO TABS: 3.0000 mg | ORAL_TABLET | Freq: Every day | ORAL | 0 refills | Status: DC

## 2021-11-22 NOTE — Progress Notes (Signed)
?  HPI with pertinent ROS:  ? ?CC: Low energy ? ?HPI: ?Pleasant 51 year old female presenting today for evaluation of low energy.  Per a MyChart message she sent around a week ago, she is having poor energy and wants to know if she can get a prescription for vitamin D as well as B12 injections. Went to a place in [place] where they checked a bunch of blood work and told her she was low in vitamin D. They did not check vitamin B12. She reports that she got connected with Endocrinology but was unsatisfied with the experience. Has an upcoming appointment with Dr. Steffanie Dunn in [place] instead.  Notes that she is looking for some guidance on nutrition for diabetic management since she has never been told what she should or should not eat.  We did do a referral to diabetes education however she notes that this was cost prohibitive.  Not currently exercising but notes that school will be out in 22 days and she is looking forward to getting back to the gym.  Feels that getting back to the gym will fix her lack of energy as well as help with some of her stress level. ? ?I reviewed the past medical history, family history, social history, surgical history, and allergies today and no changes were needed.  Please see the problem list section below in epic for further details. ? ? ?Physical exam:  ? ?General: Well Developed, well nourished, and in no acute distress.  ?Neuro: Alert and oriented x3.  ?HEENT: Normocephalic, atraumatic.  ?Skin: Warm and dry. ?Cardiac: Regular rate and rhythm, no murmurs rubs or gallops, no lower extremity edema.  ?Respiratory: Clear to auscultation bilaterally. Not using accessory muscles, speaking in full sentences. ? ?Impression and Recommendations:   ? ?1. Low energy ?Suspect this is multifactorial.  Vitamin D is a bit low so recommend supplementing with over-the-counter vitamin D as instructed.  No prescription is needed for this.  Offered to check B12 level since this was not done with her  blood work but she would like to wait on this.  She may consider an oral B12 supplement.  Advised that these are not well absorbed but if she would like to give it a try, this should be safe.  Ultimately, feel this is related to sedentary lifestyle, stress, and poor diabetes control.  Recommend following up with endocrinology as planned.  Also recommend evaluating her diet for modifications that will help control her hemoglobin A1c.  Recommend resuming regular intentional exercise since this has been helpful for her energy levels as well as stress in the past ? ?2. Type 2 diabetes mellitus with hyperglycemia, without long-term current use of insulin ([place]) ?She has not started metformin at this point.  She was instructed to stop glimepiride and start glipizide instead.  She has made this change so far.  She has continued [place].  She was previously prescribed Rybelsus but never got to take this as insurance coverage was not available.  She is interested in trying this again.  Sending in Rybelsus 3 mg daily to see if this will pass her insurance.  Ultimately, this will be in the realm of endocrinology when she gets established with Dr. Steffanie Dunn but for now, we will see what her insurance will cover that may be beneficial ?. ?Return in about 6 weeks (around 01/03/2022) for Diabetes/low energy follow-up. ?___________________________________________ ?Clearnce Sorrel, DNP, APRN, FNP-BC ?Primary Care and Sports Medicine ?[place] ?

## 2021-12-29 ENCOUNTER — Other Ambulatory Visit: Payer: Self-pay | Admitting: Medical-Surgical

## 2021-12-30 ENCOUNTER — Encounter: Payer: Self-pay | Admitting: Medical-Surgical

## 2021-12-30 ENCOUNTER — Ambulatory Visit: Payer: BC Managed Care – PPO | Admitting: Medical-Surgical

## 2021-12-30 VITALS — BP 139/81 | HR 83 | Ht 63.0 in | Wt 196.0 lb

## 2021-12-30 DIAGNOSIS — E1165 Type 2 diabetes mellitus with hyperglycemia: Secondary | ICD-10-CM

## 2021-12-30 DIAGNOSIS — E1169 Type 2 diabetes mellitus with other specified complication: Secondary | ICD-10-CM | POA: Diagnosis not present

## 2021-12-30 DIAGNOSIS — E785 Hyperlipidemia, unspecified: Secondary | ICD-10-CM

## 2021-12-30 DIAGNOSIS — Z1231 Encounter for screening mammogram for malignant neoplasm of breast: Secondary | ICD-10-CM | POA: Diagnosis not present

## 2021-12-30 MED ORDER — ATORVASTATIN CALCIUM 10 MG PO TABS: 10.0000 mg | ORAL_TABLET | Freq: Every day | ORAL | 0 refills | Status: DC

## 2021-12-30 MED ORDER — RYBELSUS 7 MG PO TABS: 7.0000 mg | ORAL_TABLET | Freq: Every day | ORAL | 0 refills | Status: DC

## 2021-12-30 NOTE — Progress Notes (Signed)
   Established Patient Office Visit  Subjective   Patient ID: Ana Jennings, female   DOB: 19-Jul-1970 Age: 51 y.o. MRN: [id number]   No chief complaint on file.   HPI Pleasant 51 year old female presenting today for follow-up on starting Rybelsus.  She has been taking 3 mg daily, tolerating well without side effects.  Feels this is worked very well for helping with her appetite but her sugars are still not at goal.  Has been checking her fasting sugars and noted the trend as they taper down to the low 200s.  The lowest she has seen is 170 but this is not the norm.  Still did not start metformin because she is nervous about GI side effects.  Has an upcoming appointment with Dr. Steffanie Dunn to establish with him for endocrinology and diabetes management.  Objective:    Vitals:   12/30/21 1340  BP: 139/81  Pulse: 83  Height: '5\' 3"'$  (1.6 m)  Weight: 196 lb (88.9 kg)  SpO2: 99%  BMI (Calculated): 34.73   Physical Exam Vitals and nursing note reviewed.  Constitutional:      General: She is not in acute distress.    Appearance: Normal appearance. She is not ill-appearing.  HENT:     Head: Normocephalic and atraumatic.  Cardiovascular:     Rate and Rhythm: Normal rate and regular rhythm.  Pulmonary:     Effort: Pulmonary effort is normal. No respiratory distress.  Skin:    General: Skin is warm and dry.  Neurological:     Mental Status: She is alert and oriented to person, place, and time.  Psychiatric:        Mood and Affect: Mood normal.        Behavior: Behavior normal.        Thought Content: Thought content normal.        Judgment: Judgment normal.   No results found for this or any previous visit (from the past 24 hour(s)).     The 10-year ASCVD risk score (Arnett DK, et al., 2019) is: 13.5%   Values used to calculate the score:     Age: 51 years     Sex: Female     Is Non-Hispanic African American: Yes     Diabetic: Yes     Tobacco smoker: No     Systolic Blood Pressure: 295  mmHg     Is BP treated: Yes     HDL Cholesterol: 48 mg/dL     Total Cholesterol: 181 mg/dL   Assessment & Plan:   1. Encounter for screening mammogram for malignant neoplasm of breast Due for mammogram.  Ordering today. - MM Digital Screening; Future  2. Type 2 diabetes mellitus with hyperglycemia, without long-term current use of insulin ([facility]) Discontinue Rybelsus 3 mg daily.  Increasing Rybelsus to 7 mg daily for better glucose management.  Continue other diabetes medicines and plan to follow-up with Dr. Steffanie Dunn next month as scheduled.  3. Hyperlipidemia associated with type 2 diabetes mellitus ([facility]) Refilling atorvastatin 10 mg daily.  Return in about 3 months (around 04/01/2022) for HTN/HLD follow up.  ___________________________________________ Clearnce Sorrel, DNP, APRN, FNP-BC Primary [facility] and [place]

## 2022-01-31 ENCOUNTER — Other Ambulatory Visit: Payer: Self-pay

## 2022-01-31 MED ORDER — RYBELSUS 7 MG PO TABS: 7.0000 mg | ORAL_TABLET | Freq: Every day | ORAL | 0 refills | Status: DC

## 2022-02-01 ENCOUNTER — Ambulatory Visit (INDEPENDENT_AMBULATORY_CARE_PROVIDER_SITE_OTHER): Payer: BC Managed Care – PPO

## 2022-02-01 DIAGNOSIS — Z1231 Encounter for screening mammogram for malignant neoplasm of breast: Secondary | ICD-10-CM | POA: Diagnosis not present

## 2022-02-07 ENCOUNTER — Ambulatory Visit: Payer: BC Managed Care – PPO | Admitting: Internal Medicine

## 2022-02-16 NOTE — Progress Notes (Signed)
Pt no showed for apt today  Owens Loffler, DO

## 2022-02-17 ENCOUNTER — Ambulatory Visit (INDEPENDENT_AMBULATORY_CARE_PROVIDER_SITE_OTHER): Payer: Self-pay | Admitting: Family Medicine

## 2022-02-17 DIAGNOSIS — Z91199 Patient's noncompliance with other medical treatment and regimen due to unspecified reason: Secondary | ICD-10-CM

## 2022-02-19 NOTE — Progress Notes (Signed)
   Acute Office Visit  Subjective:     Patient ID: Ana Jennings, female    DOB: 1971-04-30, 51 y.o.   MRN: [id number]  Chief Complaint  Patient presents with   Rash    On hands    Pt presents with rash on her right hands. She has eczema and thinks her hand rash is eczema. It is only located on her right hand and is painful. She has a family member that has similar eczema and uses a hand cream tacrolimus ointment. She tried hydrocortisone and triamcinolone cream and it has helped somewhat but has not provided much relief.     Review of Systems  Constitutional:  Negative for chills and fever.  Respiratory:  Negative for cough and shortness of breath.   Cardiovascular:  Negative for chest pain.  Skin:  Positive for rash.  Neurological:  Negative for headaches.        Objective:    BP 126/85   Pulse 85   Ht '5\' 3"'$  (1.6 m)   Wt 196 lb (88.9 kg)   SpO2 99%   BMI 34.72 kg/m    Physical Exam Vitals reviewed.  Constitutional:      Appearance: She is well-developed.  HENT:     Head: Normocephalic and atraumatic.  Eyes:     Conjunctiva/sclera: Conjunctivae normal.  Cardiovascular:     Rate and Rhythm: Normal rate.  Pulmonary:     Effort: Pulmonary effort is normal.  Skin:    Coloration: Skin is not pale.     Findings: Rash present.     Comments: Some skin breakdown noted on right hand. No erythema or swelling.  Neurological:     Mental Status: She is alert and oriented to person, place, and time.  Psychiatric:        Behavior: Behavior normal.     No results found for any visits on 02/20/22.      Assessment & Plan:   Problem List Items Addressed This Visit       Musculoskeletal and Integument   Eczema - Primary    - pt failed hydorcortisone and triamcinolone treatment for eczema rash - sent in tacrolimus medication  - if unable to get insurance coverage. Encouraged her to call derm and see if they can get it covered. If not, may have to do prior authorization.   - discussed other supportive therapies for eczema.       Meds ordered this encounter  Medications   tacrolimus (PROTOPIC) 0.1 % ointment    Sig: Apply 1 Application topically 2 (two) times daily.    Dispense:  100 g    Refill:  0    No follow-ups on file.  Owens Loffler, DO

## 2022-02-20 ENCOUNTER — Ambulatory Visit (INDEPENDENT_AMBULATORY_CARE_PROVIDER_SITE_OTHER): Payer: BC Managed Care – PPO | Admitting: Family Medicine

## 2022-02-20 ENCOUNTER — Encounter: Payer: Self-pay | Admitting: Family Medicine

## 2022-02-20 VITALS — BP 126/85 | HR 85 | Ht 63.0 in | Wt 196.0 lb

## 2022-02-20 DIAGNOSIS — L309 Dermatitis, unspecified: Secondary | ICD-10-CM | POA: Diagnosis not present

## 2022-02-20 MED ORDER — TACROLIMUS 0.1 % EX OINT: 1.0000 | TOPICAL_OINTMENT | Freq: Two times a day (BID) | CUTANEOUS | 0 refills | Status: DC

## 2022-02-20 NOTE — Assessment & Plan Note (Signed)
-   pt failed hydorcortisone and triamcinolone treatment for eczema rash - sent in tacrolimus medication  - if unable to get insurance coverage. Encouraged her to call derm and see if they can get it covered. If not, may have to do prior authorization.  - discussed other supportive therapies for eczema.

## 2022-03-24 ENCOUNTER — Other Ambulatory Visit: Payer: Self-pay | Admitting: Medical-Surgical

## 2022-04-03 ENCOUNTER — Ambulatory Visit: Payer: BC Managed Care – PPO | Admitting: Medical-Surgical

## 2022-04-05 ENCOUNTER — Encounter: Payer: Self-pay | Admitting: Medical-Surgical

## 2022-04-05 DIAGNOSIS — E1165 Type 2 diabetes mellitus with hyperglycemia: Secondary | ICD-10-CM

## 2022-04-05 MED ORDER — ONETOUCH ULTRA VI STRP: ORAL_STRIP | 12 refills | Status: DC

## 2022-04-05 MED ORDER — LANCETS 30G MISC: 99 refills | Status: DC

## 2022-04-05 MED ORDER — ONETOUCH ULTRA 2 W/DEVICE KIT: PACK | 0 refills | Status: DC

## 2022-05-13 ENCOUNTER — Other Ambulatory Visit: Payer: Self-pay | Admitting: Medical-Surgical

## 2022-05-15 IMAGING — PT NM PET TUM IMG RESTAG (PS) SKULL BASE T - THIGH
1 of 7 series · 1 of 25 positions shown · non-contrast
Comparison: 04/08/2021

CLINICAL DATA: Subsequent treatment strategy for B-cell lymphoma.

EXAM:
NUCLEAR MEDICINE PET SKULL BASE TO THIGH
TECHNIQUE: 9.2 mCi F-18 FDG was injected intravenously. Full-ring PET imaging
was performed from the skull base to thigh after the radiotracer. CT
data was obtained and used for attenuation correction and anatomic
localization.
Fasting blood glucose: 225 mg/dl

[Series 4: ct sk_thigh 5.0 bf37 · axial · 5.0mm · 0.98mm/px · 1 of 222 slices shown]
[im 222/222  brain]
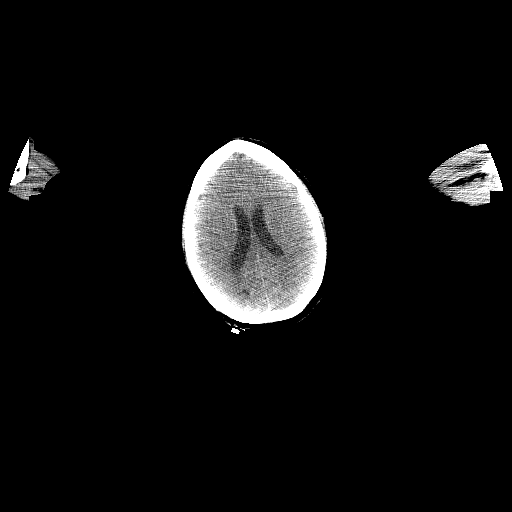

[1 of 25 positions shown; findings below may reference images not displayed]

FINDINGS: Mediastinal blood pool activity: SUV max

Liver activity: SUV max NA

NECK: No hypermetabolic lymph nodes in the neck.

Incidental CT findings: none

CHEST: No hypermetabolic mediastinal or hilar nodes. No suspicious
pulmonary nodules on the CT scan.

Incidental CT findings: none

ABDOMEN/PELVIS: No abnormal hypermetabolic activity within the
liver, pancreas, adrenal glands, or spleen. No hypermetabolic lymph
nodes in the abdomen or pelvis.

No metabolic activity associated with the surgical clips in the
sigmoid colon (image 151/CT series 4)

Incidental CT findings: Uterus and adnexa unremarkable.

SKELETON: No focal hypermetabolic activity to suggest skeletal
metastasis.

Incidental CT findings: none
IMPRESSION: 1. No evidence of lymphoma on skull base to thigh FDG PET scan.
2. Biopsy clips in the cecum.  No metabolic activity associated.

## 2022-08-30 ENCOUNTER — Other Ambulatory Visit: Payer: Self-pay | Admitting: Medical-Surgical

## 2022-09-14 ENCOUNTER — Ambulatory Visit (INDEPENDENT_AMBULATORY_CARE_PROVIDER_SITE_OTHER): Payer: BC Managed Care – PPO | Admitting: Physician Assistant

## 2022-09-14 ENCOUNTER — Encounter: Payer: Self-pay | Admitting: Physician Assistant

## 2022-09-14 ENCOUNTER — Ambulatory Visit (INDEPENDENT_AMBULATORY_CARE_PROVIDER_SITE_OTHER): Payer: BC Managed Care – PPO

## 2022-09-14 DIAGNOSIS — M25562 Pain in left knee: Secondary | ICD-10-CM

## 2022-09-14 NOTE — Progress Notes (Signed)
Office Visit Note   Patient: Ana Jennings           Date of Birth: July 28, 1970           MRN: [id number] Visit Date: 09/14/2022              Requested by: Samuel Bouche, NP [address],  [place] [id number] PCP: Samuel Bouche, NP  Chief Complaint  Patient presents with   Left Knee - Pain      HPI: Ana Jennings is a pleasant 52 year old woman with a chief complaint of intermittent left knee pain.  She denies any injuries.  She notices it most after she has been sitting and she goes to stand up.  She denies any significant swelling or redness no recent fever or chills.  This does not always bother her.  She has associated pain going up and down stairs  Assessment & Plan: Visit Diagnoses:  1. Left knee pain, unspecified chronicity     Plan: Findings consistent with some very early arthritis I think most of her problems are in the patellofemoral joint.  We talked about topical Voltaren.  Also discussed the possibility of some close chain exercises and have given her information about this.  She has an A1c most recently of over 10 so she would be a candidate for steroid injection.  She is going to try the exercises consistently for a month as well as topical Voltaren gel.  She does take Aleve and that seems to alleviate all of her symptoms  Follow-Up Instructions: No follow-ups on file.   Ortho Exam  Patient is alert, oriented, no adenopathy, well-dressed, normal affect, normal respiratory effort. Examination of her left knee no effusion no erythema no redness she does have some limited patellar mobility.  She does have grinding with range of motion.  No particular medial lateral joint pain good varus valgus stability compartments are soft and nontender she is neurovascular intact.  Imaging: No results found. No images are attached to the encounter.  Labs: Lab Results  Component Value Date   HGBA1C 10.6 (A) 10/26/2021   HGBA1C 10.2 (A) 04/26/2021   HGBA1C 9.8 (H)  10/11/2020     Lab Results  Component Value Date   ALBUMIN 4.5 06/03/2021   ALBUMIN 4.5 03/18/2021   ALBUMIN 4.6 02/28/2021    No results found for: "MG" No results found for: "VD25OH"  No results found for: "PREALBUMIN"    Latest Ref Rng & Units 06/03/2021    8:06 AM 03/18/2021   10:56 AM 10/11/2020    9:57 AM  CBC EXTENDED  WBC 4.0 - 10.5 K/uL 3.9  3.8  4.1   RBC 3.87 - 5.11 MIL/uL 5.33  5.23  5.58   Hemoglobin 12.0 - 15.0 g/dL 14.3  13.9  15.1   HCT 36.0 - 46.0 % 43.9  42.8  46.1   Platelets 150 - 400 K/uL 202  231  233   NEUT# 1.7 - 7.7 K/uL 2.2  2.1  2,255   Lymph# 0.7 - 4.0 K/uL 1.3  1.3  1,472      There is no height or weight on file to calculate BMI.  Orders:  Orders Placed This Encounter  Procedures   XR KNEE 3 VIEW LEFT   No orders of the defined types were placed in this encounter.    Procedures: No procedures performed  Clinical Data: No additional findings.  ROS:  All other systems negative, except as noted  in the HPI. Review of Systems  Objective: Vital Signs: There were no vitals taken for this visit.  Specialty Comments:  No specialty comments available.  PMFS History: Patient Active Problem List   Diagnosis Date Noted   Eczema 02/20/2022   Hyperlipidemia associated with type 2 diabetes mellitus ([facility]) 12/30/2021   Dyslipidemia 10/26/2021   Psoriasis 07/28/2021   Seborrheic dermatitis 07/28/2021   Postmenopausal bleeding 11/29/2020   Musculoskeletal strain 11/29/2020   Feeling of sadness 11/29/2020   Facial eczema 10/11/2020   Hypertension associated with diabetes ([place]) 10/11/2020   Type 2 diabetes mellitus with hyperglycemia, without long-term current use of insulin ([place]) 10/11/2020   History of iron deficiency 10/11/2020   Trouble in sleeping 10/11/2020   Past Medical History:  Diagnosis Date   Diabetes mellitus without complication ([place])    on meds   Hyperlipidemia    on meds   Hypertension    on meds    Family  History  Problem Relation Age of Onset   Diabetes Mother    COPD Mother    Diabetes Father    Cancer Father        pancreatic   Diabetes Maternal Grandmother    Diabetes Paternal Grandmother    Colon polyps Neg Hx    Colon cancer Neg Hx    Esophageal cancer Neg Hx    Rectal cancer Neg Hx    Stomach cancer Neg Hx     Past Surgical History:  Procedure Laterality Date   HYSTEROSCOPY WITH D & C     polyp removal x 2   REDUCTION MAMMAPLASTY Bilateral 2013   WISDOM TOOTH EXTRACTION     Social History   Occupational History   Not on file  Tobacco Use   Smoking status: Never   Smokeless tobacco: Never  Vaping Use   Vaping Use: Never used  Substance and Sexual Activity   Alcohol use: Not Currently   Drug use: Never   Sexual activity: Yes    Birth control/protection: None

## 2022-09-15 ENCOUNTER — Ambulatory Visit: Payer: BC Managed Care – PPO | Admitting: Medical-Surgical

## 2022-10-01 ENCOUNTER — Encounter: Payer: Self-pay | Admitting: Medical-Surgical

## 2022-10-09 ENCOUNTER — Encounter: Payer: Self-pay | Admitting: Medical-Surgical

## 2022-10-09 ENCOUNTER — Ambulatory Visit: Payer: BC Managed Care – PPO | Admitting: Medical-Surgical

## 2022-10-09 VITALS — BP 122/79 | HR 81 | Resp 20 | Ht 63.0 in | Wt 194.8 lb

## 2022-10-09 DIAGNOSIS — Z566 Other physical and mental strain related to work: Secondary | ICD-10-CM | POA: Diagnosis not present

## 2022-10-09 DIAGNOSIS — E1165 Type 2 diabetes mellitus with hyperglycemia: Secondary | ICD-10-CM

## 2022-10-09 DIAGNOSIS — I152 Hypertension secondary to endocrine disorders: Secondary | ICD-10-CM

## 2022-10-09 DIAGNOSIS — E1159 Type 2 diabetes mellitus with other circulatory complications: Secondary | ICD-10-CM | POA: Diagnosis not present

## 2022-10-09 DIAGNOSIS — E6609 Other obesity due to excess calories: Secondary | ICD-10-CM | POA: Diagnosis not present

## 2022-10-09 DIAGNOSIS — Z6834 Body mass index (BMI) 34.0-34.9, adult: Secondary | ICD-10-CM

## 2022-10-09 LAB — POCT GLYCOSYLATED HEMOGLOBIN (HGB A1C): Hemoglobin A1C: 11.6 % — AB (ref 4.0–5.6)

## 2022-10-09 LAB — POCT UA - MICROALBUMIN
Creatinine, POC: 50 mg/dL
Microalbumin Ur, POC: 10 mg/L

## 2022-10-09 MED ORDER — SERTRALINE HCL 50 MG PO TABS: ORAL_TABLET | ORAL | 3 refills | Status: DC

## 2022-10-09 MED ORDER — RYBELSUS 14 MG PO TABS: 14.0000 mg | ORAL_TABLET | Freq: Every day | ORAL | 1 refills | Status: DC

## 2022-10-09 NOTE — Progress Notes (Signed)
Established Patient Office Visit  Subjective   Patient ID: Ana Jennings, female   DOB: 1970/11/05 Age: 52 y.o. MRN: [id number]   Chief Complaint  Patient presents with   Insomnia   HPI Pleasant 52 year old female presenting today for the following:  DM: not checking blood sugars at home. Has been working on her diet and trying to stay active doing Pilates. Taking Jardiance 25mg  daily, Januvia 100mg  daily, Glipizide 5mg  twice daily, and Rybelsus 7mg  daily. Tolerating all medications well. Feels that her clothes are fitting better even though the scale hasn't changed that much.   HTN: taking Amlodipine 5mg  daily, tolerating well without side effects. Working on a low sodium diet. Exercising as noted above. Denies CP, SOB, palpitations, lower extremity edema, dizziness, headaches, or vision changes.  Mood: has a lot going on recently and notes quite a bit of job stress. Not on any medications or doing any counseling. Has trouble sleeping some nights where she will wake up in the middle of the night. She is usually awake for an hour with her mind racing to think of all the things that need to be done and form a plan of attack. Sometimes she will get up and take a shower before laying back down to go back to sleep. Notes that some nights she is able to sleep all night without waking. Doesn't feel that the sleeping issue is troublesome at this point but is feeling overwhelmed. Interested in options that may help handle some of the stress a bit better.   Objective:    Vitals:   10/09/22 1347  BP: 122/79  Pulse: 81  Resp: 20  Height: 5\' 3"  (1.6 m)  Weight: 194 lb 12.8 oz (88.4 kg)  SpO2: 98%  BMI (Calculated): 34.52    Physical Exam Vitals reviewed.  Constitutional:      General: She is not in acute distress.    Appearance: Normal appearance. She is obese. She is not ill-appearing.  HENT:     Head: Normocephalic and atraumatic.  Cardiovascular:     Rate and Rhythm: Normal rate and regular  rhythm.     Pulses: Normal pulses.     Heart sounds: Normal heart sounds.  Pulmonary:     Effort: Pulmonary effort is normal. No respiratory distress.     Breath sounds: Normal breath sounds. No wheezing, rhonchi or rales.  Skin:    General: Skin is warm and dry.  Neurological:     Mental Status: She is alert and oriented to person, place, and time.  Psychiatric:        Mood and Affect: Mood normal.        Behavior: Behavior normal.        Thought Content: Thought content normal.        Judgment: Judgment normal.   No results found for this or any previous visit (from the past 24 hour(s)).     The 10-year ASCVD risk score (Arnett DK, et al., 2019) is: 5.6%   Values used to calculate the score:     Age: 80 years     Sex: Female     Is Non-Hispanic African American: Yes     Diabetic: Yes     Tobacco smoker: No     Systolic Blood Pressure: 123XX123 mmHg     Is BP treated: No     HDL Cholesterol: 48 mg/dL     Total Cholesterol: 181 mg/dL   Assessment & Plan:   1. Type 2 diabetes  mellitus with hyperglycemia, without long-term current use of insulin (HCC) POCT A1c up to 11.6%. Discussed the recommendation for adding insulin at this point. She is very hesitant to agree to this for now. Plan to increase Rybelsus to 14mg  daily and continue Glipizide, Januvia, and Jardiance. POCT microalbumin abnormal. Checking CMP and lipid panel. Previously referred to Endocrinology but patient did not hear from them and the referral has been closed. Re-entering a referral to get her in with Dr. Steffanie Dunn in [place]. Advised to contact us if she has not heard from them in the next couple of weeks. For now, strongly recommend she check her glucose fasting daily with a goal of 120 or less. Approximately 2-3 times weekly, I would like her to check her sugars 2 hours after her largest meal of the day with a goal of 180 or less. Extensive discussion held on recommendations for a low carb diet and foods to avoid.  Previously tried to get her in with Diabetic education however, the cost was not financially feasible.  - POCT HgB A1C - POCT UA - Microalbumin - Ambulatory referral to Endocrinology - COMPLETE METABOLIC PANEL WITH GFR - Lipid panel  2. Class 1 obesity due to excess calories with serious comorbidity and body mass index (BMI) of 34.0 to 34.9 in adult Discussed options available to help with continued weight loss and nutrition guidance. Referring to [facility].  - Amb Ref to Medical Weight Management  3. Stress at work Starting Zoloft 25mg  daily x 1 week then increase to 50mg  daily. Referring to behavioral health for counseling.  - Ambulatory referral to [facility]  4. Hypertension associated with diabetes ([facility]) Checking labs as below. BP at goal today. Continue amlodipine 5mg  daily. Continue low sodium diet and regular intentional exercise with a goal for weight loss to a healthy weight.  - CBC with Differential/Platelet - COMPLETE METABOLIC PANEL WITH GFR - Lipid panel  Return in about 3 months (around 01/09/2023) for DM follow up.  ___________________________________________ Clearnce Sorrel, DNP, APRN, FNP-BC Primary Care and [place]

## 2022-10-10 LAB — HM DIABETES EYE EXAM

## 2022-10-11 ENCOUNTER — Encounter: Payer: Self-pay | Admitting: Medical-Surgical

## 2022-10-11 LAB — COMPLETE METABOLIC PANEL WITH GFR
AG Ratio: 1.5 (calc) (ref 1.0–2.5)
ALT: 21 U/L (ref 6–29)
AST: 17 U/L (ref 10–35)
Albumin: 4.7 g/dL (ref 3.6–5.1)
Alkaline phosphatase (APISO): 93 U/L (ref 37–153)
BUN: 10 mg/dL (ref 7–25)
CO2: 28 mmol/L (ref 20–32)
Calcium: 9.8 mg/dL (ref 8.6–10.4)
Chloride: 101 mmol/L (ref 98–110)
Creat: 0.56 mg/dL (ref 0.50–1.03)
Globulin: 3.2 g/dL (calc) (ref 1.9–3.7)
Glucose, Bld: 225 mg/dL — ABNORMAL HIGH (ref 65–99)
Potassium: 4.1 mmol/L (ref 3.5–5.3)
Sodium: 138 mmol/L (ref 135–146)
Total Bilirubin: 0.7 mg/dL (ref 0.2–1.2)
Total Protein: 7.9 g/dL (ref 6.1–8.1)
eGFR: 109 mL/min/{1.73_m2} (ref >=60)

## 2022-10-11 LAB — CBC WITH DIFFERENTIAL/PLATELET
Absolute Monocytes: 228 cells/uL (ref 200–950)
Basophils Absolute: 31 cells/uL (ref 0–200)
Basophils Relative: 0.9 %
Eosinophils Absolute: 41 cells/uL (ref 15–500)
Eosinophils Relative: 1.2 %
HCT: 47.1 % — ABNORMAL HIGH (ref 35.0–45.0)
Hemoglobin: 15.4 g/dL (ref 11.7–15.5)
Lymphs Abs: 1125 cells/uL (ref 850–3900)
MCH: 26.4 pg — ABNORMAL LOW (ref 27.0–33.0)
MCHC: 32.7 g/dL (ref 32.0–36.0)
MCV: 80.8 fL (ref 80.0–100.0)
MPV: 11 fL (ref 7.5–12.5)
Monocytes Relative: 6.7 %
Neutro Abs: 1975 cells/uL (ref 1500–7800)
Neutrophils Relative %: 58.1 %
Platelets: 236 10*3/uL (ref 140–400)
RBC: 5.83 10*6/uL — ABNORMAL HIGH (ref 3.80–5.10)
RDW: 13.5 % (ref 11.0–15.0)
Total Lymphocyte: 33.1 %
WBC: 3.4 10*3/uL — ABNORMAL LOW (ref 3.8–10.8)

## 2022-10-11 LAB — LIPID PANEL
Cholesterol: 149 mg/dL (ref <200)
HDL: 53 mg/dL (ref >=50)
LDL Cholesterol (Calc): 78 mg/dL (calc)
Non-HDL Cholesterol (Calc): 96 mg/dL (calc) (ref <130)
Total CHOL/HDL Ratio: 2.8 (calc) (ref <5.0)
Triglycerides: 93 mg/dL (ref <150)

## 2022-10-12 MED ORDER — ATORVASTATIN CALCIUM 20 MG PO TABS: 20.0000 mg | ORAL_TABLET | Freq: Every day | ORAL | 3 refills | Status: DC

## 2022-10-12 MED ORDER — AMLODIPINE BESYLATE 5 MG PO TABS: 5.0000 mg | ORAL_TABLET | Freq: Every day | ORAL | 1 refills | Status: DC

## 2022-10-12 NOTE — Addendum Note (Signed)
Addended bySamuel Bouche on: 10/12/2022 01:10 PM   Modules accepted: Orders

## 2022-10-17 ENCOUNTER — Other Ambulatory Visit: Payer: Self-pay | Admitting: Internal Medicine

## 2022-10-20 ENCOUNTER — Encounter: Payer: Self-pay | Admitting: Medical-Surgical

## 2022-11-06 ENCOUNTER — Encounter: Payer: Self-pay | Admitting: Medical-Surgical

## 2022-11-20 ENCOUNTER — Ambulatory Visit (HOSPITAL_COMMUNITY): Payer: Self-pay | Admitting: Licensed Clinical Social Worker

## 2022-12-04 ENCOUNTER — Ambulatory Visit (HOSPITAL_COMMUNITY): Payer: Self-pay | Admitting: Licensed Clinical Social Worker

## 2022-12-08 MED ORDER — GLIPIZIDE 5 MG PO TABS: 10.0000 mg | ORAL_TABLET | Freq: Two times a day (BID) | ORAL | 0 refills | Status: DC

## 2023-01-01 ENCOUNTER — Ambulatory Visit: Payer: BC Managed Care – PPO | Admitting: Medical-Surgical

## 2023-01-01 ENCOUNTER — Encounter: Payer: Self-pay | Admitting: Medical-Surgical

## 2023-01-01 VITALS — BP 125/82 | HR 74 | Resp 20 | Ht 63.0 in | Wt 192.5 lb

## 2023-01-01 DIAGNOSIS — Z8639 Personal history of other endocrine, nutritional and metabolic disease: Secondary | ICD-10-CM

## 2023-01-01 DIAGNOSIS — Z7984 Long term (current) use of oral hypoglycemic drugs: Secondary | ICD-10-CM

## 2023-01-01 DIAGNOSIS — R5383 Other fatigue: Secondary | ICD-10-CM | POA: Diagnosis not present

## 2023-01-01 DIAGNOSIS — E1165 Type 2 diabetes mellitus with hyperglycemia: Secondary | ICD-10-CM

## 2023-01-01 NOTE — Progress Notes (Signed)
        Established patient visit  History, exam, impression, and plan:  1. Type 2 diabetes mellitus with hyperglycemia, without long-term current use of insulin [facility]) Pleasant 52 year old female presenting today for follow-up on diabetes.  Her last hemoglobin A1c was checked 3 months ago at 11.6%.  Has been checking her sugars occasionally and notes readings approximately 180 postprandially.  Not able to recall fasting readings at this time.  Has been working on dietary modifications.  Currently taking Jardiance 25 mg daily, glipizide 10 mg twice daily, Januvia 100 mg daily, and Rybelsus 14 mg daily.  Tolerating all medications well without side effects.  Unfortunately, she is about a week early for recheck of her hemoglobin A1c.  Ordering today as a lab draw with instructions to come back after 6/25 to have this done. - Hemoglobin A1c  2. History of iron deficiency 3. Low energy This that she does have a history of iron deficiency and has been experiencing significantly low energy recently.  Would like to have some lab work rechecked to make sure that her iron and vitamin D levels are doing okay.  Lab orders placed below to have this drawn with her hemoglobin A1c. - CBC with Differential/Platelet - Iron, TIBC and Ferritin Panel - VITAMIN D 25 Hydroxy (Vit-D Deficiency, Fractures)   Procedures performed this visit: None.  Return in about 3 months (around 04/03/2023) for DM follow up.  __________________________________ Thayer Ohm, DNP, APRN, FNP-BC Primary Care and Sports Medicine [facility] [place]

## 2023-03-07 ENCOUNTER — Other Ambulatory Visit: Payer: Self-pay | Admitting: Medical-Surgical

## 2023-04-09 ENCOUNTER — Other Ambulatory Visit: Payer: Self-pay | Admitting: Medical-Surgical

## 2023-04-16 ENCOUNTER — Encounter: Payer: Self-pay | Admitting: Medical-Surgical

## 2023-04-16 ENCOUNTER — Ambulatory Visit: Payer: BC Managed Care – PPO | Admitting: Medical-Surgical

## 2023-04-16 VITALS — BP 139/83 | HR 91 | Resp 20 | Ht 63.0 in | Wt 190.2 lb

## 2023-04-16 DIAGNOSIS — E1165 Type 2 diabetes mellitus with hyperglycemia: Secondary | ICD-10-CM | POA: Diagnosis not present

## 2023-04-16 DIAGNOSIS — E1169 Type 2 diabetes mellitus with other specified complication: Secondary | ICD-10-CM | POA: Diagnosis not present

## 2023-04-16 DIAGNOSIS — E1159 Type 2 diabetes mellitus with other circulatory complications: Secondary | ICD-10-CM

## 2023-04-16 DIAGNOSIS — Z7984 Long term (current) use of oral hypoglycemic drugs: Secondary | ICD-10-CM

## 2023-04-16 DIAGNOSIS — E785 Hyperlipidemia, unspecified: Secondary | ICD-10-CM

## 2023-04-16 DIAGNOSIS — I152 Hypertension secondary to endocrine disorders: Secondary | ICD-10-CM | POA: Diagnosis not present

## 2023-04-16 DIAGNOSIS — R5383 Other fatigue: Secondary | ICD-10-CM

## 2023-04-16 DIAGNOSIS — Z8639 Personal history of other endocrine, nutritional and metabolic disease: Secondary | ICD-10-CM

## 2023-04-16 DIAGNOSIS — F4329 Adjustment disorder with other symptoms: Secondary | ICD-10-CM

## 2023-04-16 LAB — POCT GLYCOSYLATED HEMOGLOBIN (HGB A1C): Hemoglobin A1C: 13 % — AB (ref 4.0–5.6)

## 2023-04-16 MED ORDER — ONETOUCH ULTRA VI STRP: ORAL_STRIP | 12 refills | Status: AC

## 2023-04-16 MED ORDER — INSULIN PEN NEEDLE 31G X 5 MM MISC: 3 refills | Status: AC

## 2023-04-16 MED ORDER — TOUJEO SOLOSTAR 300 UNIT/ML ~~LOC~~ SOPN: 10.0000 [IU] | PEN_INJECTOR | Freq: Every day | SUBCUTANEOUS | 3 refills | Status: DC

## 2023-04-16 MED ORDER — LANCETS 30G MISC: 99 refills | Status: AC

## 2023-04-16 MED ORDER — ONETOUCH ULTRA 2 W/DEVICE KIT: PACK | 0 refills | Status: AC

## 2023-04-16 NOTE — Patient Instructions (Signed)
Increase Glipizide to 5mg  twice daily Continue Jardiance and Januvia Continue Rybelsus  Adding Toujeo 10 units daily. Use 10 units daily for 1 week then start up titration of 2 units twice weekly until fasting sugars are 90-120.

## 2023-04-16 NOTE — Progress Notes (Signed)
        Established patient visit  History, exam, impression, and plan:  1. Type 2 diabetes mellitus with hyperglycemia, without long-term current use of insulin [facility]) Very pleasant 52 year old female presenting today for follow-up on type 2 diabetes.  This is recently then uncontrolled and we have been struggling to get back in check.  She saw endocrinology in [place] last year but did not like the experience she had.  She requested a referral to a different endocrinology which was placed in March but has not heard from them for an appointment.  She has not been checking her blood sugars and is requesting supplies for a glucometer and associated lancets/strips.  She is taking glipizide 5 mg once daily, Rybelsus 14 mg daily, Januvia 100 mg daily and Jardiance 25 mg daily. POCT A1c 3 months ago 11.6%, recheck today 13%. Unfortunately, has not been able to get her diabetes controlled with oral options. Discussed switching to Ozempic but she is hesitant to do this for now. Adding Toujeo 10 units daily with plan to titrate by 2 units twice weekly for fasting sugars of 90-120. Sending Glucometer with kit to check fasting sugars daily.  - POCT HgB A1C - Blood Glucose Monitoring Suppl (ONE TOUCH ULTRA 2) w/Device KIT; Dx DM. Check fasting blood sugar every morning and 2 hours after largest meal of the day a few days a week.  Dispense: 1 kit; Refill: 0 - glucose blood (ONETOUCH ULTRA) test strip; Dx DM. Check fasting blood sugar every morning and 2 hours after largest meal of the day a few days a week.  Dispense: 100 each; Refill: 12 - Lancets 30G MISC; Dx DM. Check fasting blood sugar every morning and 2 hours after largest meal of the day a few days a week.  Dispense: 100 each; Refill: PRN  2. Hypertension associated with diabetes (HCC) Hx of HTN currently treated with Amlodipine 5mg  daily. Tolerating well without side effects. Not regularly checking blood pressures at home.  Following a low-sodium diet  most of the time.  Stays physically active and has made some changes with dietary habits.  Plans to start going to the gym and putting her health first.  Blood pressure slightly elevated today but not significantly so.  Continue amlodipine as prescribed.  Work on regular intentional exercise with a goal for weight loss to a healthy weight. - CBC with Differential/Platelet - CMP14+EGFR  3. Hyperlipidemia associated with type 2 diabetes mellitus (HCC) Currently taking Lipitor 20 mg daily, tolerating well without side effects.  Up-to-date on lipid checks.  Checking CMP today.  Continue Lipitor as prescribed. - CMP14+EGFR  4. History of iron deficiency History of iron deficiency with no recent checks.  She is feeling fatigued with low energy most of the time.  Rechecking CBC and iron panel today. - CBC with Differential/Platelet - Iron, TIBC and Ferritin Panel  5. Low energy As noted above, feeling fatigued with low energy.  Checking vitamin D today. - VITAMIN D 25 Hydroxy (Vit-D Deficiency, Fractures)  6. Stress and adjustment reaction Not currently on medications but feels that she would like to start counseling.  Referral placed today. - Ambulatory referral to Behavioral Health   Procedures performed this visit: None.  Return in about 3 months (around 07/16/2023) for DM follow up.  __________________________________ Thayer Ohm, DNP, APRN, FNP-BC Primary Care and Sports Medicine [facility]

## 2023-04-18 ENCOUNTER — Encounter: Payer: Self-pay | Admitting: Medical-Surgical

## 2023-04-18 LAB — ADVANCED WRITTEN NOTIFICATION (AWN) TEST REFUSAL: AWN TEST REFUSED: 17306

## 2023-04-18 LAB — CBC WITH DIFFERENTIAL/PLATELET
Absolute Monocytes: 280 {cells}/uL (ref 200–950)
Basophils Absolute: 52 {cells}/uL (ref 0–200)
Basophils Relative: 1.3 %
Eosinophils Absolute: 72 {cells}/uL (ref 15–500)
Eosinophils Relative: 1.8 %
HCT: 46.6 % — ABNORMAL HIGH (ref 35.0–45.0)
Hemoglobin: 14.7 g/dL (ref 11.7–15.5)
Lymphs Abs: 1248 {cells}/uL (ref 850–3900)
MCH: 26.4 pg — ABNORMAL LOW (ref 27.0–33.0)
MCHC: 31.5 g/dL — ABNORMAL LOW (ref 32.0–36.0)
MCV: 83.7 fL (ref 80.0–100.0)
MPV: 11.1 fL (ref 7.5–12.5)
Monocytes Relative: 7 %
Neutro Abs: 2348 {cells}/uL (ref 1500–7800)
Neutrophils Relative %: 58.7 %
Platelets: 219 10*3/uL (ref 140–400)
RBC: 5.57 10*6/uL — ABNORMAL HIGH (ref 3.80–5.10)
RDW: 13 % (ref 11.0–15.0)
Total Lymphocyte: 31.2 %
WBC: 4 10*3/uL (ref 3.8–10.8)

## 2023-04-18 LAB — COMPLETE METABOLIC PANEL WITH GFR
AG Ratio: 1.6 (calc) (ref 1.0–2.5)
ALT: 23 U/L (ref 6–29)
AST: 15 U/L (ref 10–35)
Albumin: 4.6 g/dL (ref 3.6–5.1)
Alkaline phosphatase (APISO): 103 U/L (ref 37–153)
BUN: 7 mg/dL (ref 7–25)
CO2: 30 mmol/L (ref 20–32)
Calcium: 9.9 mg/dL (ref 8.6–10.4)
Chloride: 98 mmol/L (ref 98–110)
Creat: 0.61 mg/dL (ref 0.50–1.03)
Globulin: 2.8 g/dL (ref 1.9–3.7)
Glucose, Bld: 390 mg/dL — ABNORMAL HIGH (ref 65–99)
Potassium: 4.6 mmol/L (ref 3.5–5.3)
Sodium: 136 mmol/L (ref 135–146)
Total Bilirubin: 0.6 mg/dL (ref 0.2–1.2)
Total Protein: 7.4 g/dL (ref 6.1–8.1)
eGFR: 106 mL/min/{1.73_m2} (ref >=60)

## 2023-04-18 LAB — IRON,TIBC AND FERRITIN PANEL
%SAT: 16 % (ref 16–45)
Ferritin: 50 ng/mL (ref 16–232)
Iron: 61 ug/dL (ref 45–160)
TIBC: 381 ug/dL (ref 250–450)

## 2023-04-30 ENCOUNTER — Other Ambulatory Visit: Payer: Self-pay | Admitting: Medical-Surgical

## 2023-04-30 ENCOUNTER — Other Ambulatory Visit: Payer: Self-pay | Admitting: Internal Medicine

## 2023-05-03 ENCOUNTER — Encounter: Payer: Self-pay | Admitting: Medical-Surgical

## 2023-06-04 ENCOUNTER — Encounter: Payer: BC Managed Care – PPO | Attending: Medical-Surgical | Admitting: Nutrition

## 2023-06-04 DIAGNOSIS — E1165 Type 2 diabetes mellitus with hyperglycemia: Secondary | ICD-10-CM | POA: Insufficient documentation

## 2023-06-04 DIAGNOSIS — Z713 Dietary counseling and surveillance: Secondary | ICD-10-CM | POA: Diagnosis not present

## 2023-06-04 DIAGNOSIS — Z794 Long term (current) use of insulin: Secondary | ICD-10-CM | POA: Insufficient documentation

## 2023-06-04 DIAGNOSIS — Z7985 Long-term (current) use of injectable non-insulin antidiabetic drugs: Secondary | ICD-10-CM | POA: Diagnosis not present

## 2023-06-05 NOTE — Patient Instructions (Addendum)
Call [employer] help line to link sensor to phone app. 2.  Start walking for a total of 30-40 minutes per day 3.  Change sensor every 14 days.   4.  Stop bacon, or limit to once a week for breakfast.and switch to egg or other protein of your choice from protein list given.

## 2023-06-05 NOTE — Progress Notes (Signed)
Patient is here for diabetes evaluation and education.  She reports that both her parents have diabetes and she know what to eat but not sure of amounts. Says drinks no sweet drinks or juices. SBGM:  meter.  Says FBS today was 190. "No reason for this"              Has Josephine Igo 3 in the car, but has not started it as yet.   Exercise.  Has done very little since moving back from [place] 3 years ago.  Was walking 3 miles every day. " No time now" Diabetes Medication:  Oxempic X1 week.  Denies nausea or feeling fullness or lack of appetitie                                     Toujeo 18u q AM Diet:  Patient very vague about what she is eating.  Says snacks during the day, rather than a meal.  Supper is sit down with husband.        Typical day: 7AM up 7:30:  3-4 pieces of bacon, sometimes on egg, and 1 piece of raisin bran toast.. Sometimes fruit, kinds and amount varies  with season 10:30-3PM: snacks ususally, does not set down for lunch and will eat yogurt, or fruit, or "whatever she has" 6-8PM: supper:  protein in 4-7 ounces, 1 starchy veg.-usually 30 grams, and 1-2 non starchy veg.  Water with artificial sweetener and flavors added Denies eating after supper.  Discussion: Development and progression of type 2 diabetes and the idea of insulin resistance and ways to decrease this resistance, with diet exercise and waist loss size.   Idea of 3 basic food groups-protein, carbs and fat, what food fall into each group, the need for all three groups at each meal, and how much she should be having at each meal. 1800 calorie diet with 30 grams of carbs at breakfast, 30 at lunch, and 45 at supper, with 15 at snack.  Protein is 2 ounces breakfast, 3  at lunch and 3-4 at supper and one at snack.  Fat is 1 serving at each meal and snack.   Stop bacon, or limit to once a week for breakfast.and switch to egg or other protein of your choice from protein list given.   Timing, How her injected insulin works and the fact  that we are relying on her own body's insulin to bring down her post meal blood sugar rise. Need for exercise to decrease insulin resistance at the cellular level and blood sugar after meals.  What  exercises will work for her and the need for a total of 30-40 minutes of brisk  walking 4-5 days/wk. [employer] 3 sensor app was downloaded to her phone and sensor sample was given to her and applied to patient's left arm.  Lot#: [id number]  Exp. 1/25.  The phone would not link to the sensor. Pt. Was told to call their help line for some assistance with this.  Telephone number given.  Discussed the difference between sensor readings and blood glucose readings as well as goal for readings:  ac: less than 110, and more important, 2hr. Pc: less than 150.  Pt. Reported good understanding of this.    Believe overall understanding is good and motivation for needed diet and exercise changes is good at this time.

## 2023-06-13 ENCOUNTER — Encounter: Payer: Self-pay | Admitting: Medical-Surgical

## 2023-06-13 ENCOUNTER — Ambulatory Visit: Payer: BC Managed Care – PPO | Admitting: Licensed Clinical Social Worker

## 2023-06-13 ENCOUNTER — Other Ambulatory Visit: Payer: Self-pay | Admitting: Medical-Surgical

## 2023-06-13 NOTE — Telephone Encounter (Signed)
Copied from CRM [id number]. Topic: Clinical - Prescription Issue >> Jun 13, 2023 12:14 PM Sasha H wrote: Reason for CRM: pt states her blood pressure medication, amLODipine (NORVASC) 5 MG tablet was not sent in with the rest of her refills and she is out. She would like for it to be filled asap.

## 2023-07-20 ENCOUNTER — Ambulatory Visit: Payer: BC Managed Care – PPO | Admitting: Medical-Surgical

## 2023-08-18 ENCOUNTER — Other Ambulatory Visit: Payer: Self-pay | Admitting: Medical-Surgical

## 2023-08-20 NOTE — Telephone Encounter (Signed)
Called patient, no voicemail, will call back later, thanks.

## 2023-08-20 NOTE — Telephone Encounter (Signed)
Pls contact the pt to schedule DM appt with [facility]. Sending 30 day refill. Thx.

## 2023-10-07 ENCOUNTER — Other Ambulatory Visit: Payer: Self-pay | Admitting: Medical-Surgical

## 2023-10-10 ENCOUNTER — Other Ambulatory Visit: Payer: Self-pay | Admitting: Medical-Surgical

## 2023-11-20 ENCOUNTER — Telehealth: Payer: Self-pay

## 2023-11-20 NOTE — Telephone Encounter (Signed)
 Patient was identified as falling into the [place] Measure - Diabetes.   Patient was: Left voicemail to schedule with primary care provider.

## 2023-11-29 ENCOUNTER — Other Ambulatory Visit: Payer: Self-pay | Admitting: Medical-Surgical

## 2023-11-29 MED ORDER — AMLODIPINE BESYLATE 5 MG PO TABS: 5.0000 mg | ORAL_TABLET | Freq: Every day | ORAL | 0 refills | Status: DC

## 2023-11-29 NOTE — Telephone Encounter (Signed)
 Copied from CRM [id number]. Topic: Clinical - Medication Refill >> Nov 29, 2023  9:07 AM Blair Bumpers wrote: Medication: amLODipine  (NORVASC ) 5 MG tablet  Has the patient contacted their pharmacy? No (Agent: If no, request that the patient contact the pharmacy for the refill. If patient does not wish to contact the pharmacy document the reason why and proceed with request.) (Agent: If yes, when and what did the pharmacy advise?)  This is the patient's preferred pharmacy:  [facility] [address] [place], [place] - [address] [place] [address] Phone: [phone] Fax: [phone]  Is this the correct pharmacy for this prescription? Yes If no, delete pharmacy and type the correct one.   Has the prescription been filled recently? Yes  Is the patient out of the medication? Yes  Has the patient been seen for an appointment in the last year OR does the patient have an upcoming appointment? Yes  Can we respond through MyChart? Yes  Agent: Please be advised that Rx refills may take up to 3 business days. We ask that you follow-up with your pharmacy.

## 2023-12-03 ENCOUNTER — Ambulatory Visit (INDEPENDENT_AMBULATORY_CARE_PROVIDER_SITE_OTHER): Payer: Self-pay | Admitting: Medical-Surgical

## 2023-12-03 ENCOUNTER — Encounter: Payer: Self-pay | Admitting: Medical-Surgical

## 2023-12-03 VITALS — BP 133/82 | HR 82 | Resp 20 | Ht 63.0 in | Wt 198.3 lb

## 2023-12-03 DIAGNOSIS — E785 Hyperlipidemia, unspecified: Secondary | ICD-10-CM

## 2023-12-03 DIAGNOSIS — Z7984 Long term (current) use of oral hypoglycemic drugs: Secondary | ICD-10-CM

## 2023-12-03 DIAGNOSIS — R300 Dysuria: Secondary | ICD-10-CM

## 2023-12-03 DIAGNOSIS — E1165 Type 2 diabetes mellitus with hyperglycemia: Secondary | ICD-10-CM | POA: Diagnosis not present

## 2023-12-03 DIAGNOSIS — E1159 Type 2 diabetes mellitus with other circulatory complications: Secondary | ICD-10-CM | POA: Diagnosis not present

## 2023-12-03 DIAGNOSIS — I152 Hypertension secondary to endocrine disorders: Secondary | ICD-10-CM

## 2023-12-03 DIAGNOSIS — E1169 Type 2 diabetes mellitus with other specified complication: Secondary | ICD-10-CM | POA: Diagnosis not present

## 2023-12-03 LAB — POCT GLYCOSYLATED HEMOGLOBIN (HGB A1C)
HbA1c, POC (controlled diabetic range): 11.5 % — AB (ref 0.0–7.0)
Hemoglobin A1C: 11.5 % — AB (ref 4.0–5.6)

## 2023-12-03 LAB — POCT URINALYSIS DIP (CLINITEK)
Bilirubin, UA: NEGATIVE
Glucose, UA: 500 mg/dL — AB
Ketones, POC UA: NEGATIVE mg/dL
Leukocytes, UA: NEGATIVE
Nitrite, UA: NEGATIVE
POC PROTEIN,UA: NEGATIVE
Spec Grav, UA: <=1.005 — AB (ref 1.010–1.025)
Urobilinogen, UA: 0.2 U/dL
pH, UA: 6 (ref 5.0–8.0)

## 2023-12-03 LAB — POCT UA - MICROALBUMIN
Creatinine, POC: 50 mg/dL
Microalbumin Ur, POC: 10 mg/L

## 2023-12-03 NOTE — Progress Notes (Signed)
        Established patient visit  History, exam, impression, and plan:  1. Type 2 diabetes mellitus with hyperglycemia, without long-term current use of insulin  (HCC) (Primary) Pleasant 53 year old female presenting today with a history of type 2 diabetes with a hemoglobin A1c of 13% when checked in September.  At that point, she was referred to an endocrinologist and has been able to establish there.  Prior records, her last visit was in November although she does mention she went there in January.  She has been connected with a diabetic nutritionist who is working with her however she feels that she needs someone more strict to help keep her in mind.  POCT hemoglobin A1c rechecked at 11.5% showing some improvement but still entirely uncontrolled.  POCT microalbumin abnormal indicating stress on the kidneys.  Checking CMP.  She is currently taking Ozempic 0.25 mg as the 0.5 mg dose left her with severe nausea and vomiting.  She is also taking Toujeo  18 units daily.  Has been taking Jardiance  25 mg daily but is out of this and needs a refill.  Also on glipizide  XL 10 mg daily.  Admits that she has room for improvement in her diet as well as exercise habits but she is struggling to make this a lifestyle change.  Since she has already established with endocrinology, I would recommend that she reach out to them to reschedule an appointment to discuss her current regiment and the abnormal microalbumin.  Diabetic foot exam completed today, passing.  I am sending refills of her prescriptions without changes at this time but I do recommend that she talk to them about possibly switching her to Mounjaro as well as a titration of Toujeo  up/down to achieve fasting goals of 90-120. - HM Diabetes Foot Exam - POCT UA - Microalbumin - POCT HgB A1C - CMP14+EGFR  2. Hypertension associated with diabetes (HCC) History of hypertension its associated with diabetes.  She is not checking blood pressures at home and does not  have a cuff.  Denies any concerning symptoms today.  Cardiopulmonary exam is normal.  She is taking amlodipine  5 mg daily, tolerating well without side effects.  Blood pressure is at goal.  Checking labs as below.  Continue amlodipine  as prescribed. - CBC with Differential/Platelet - CMP14+EGFR  3. Hyperlipidemia associated with type 2 diabetes mellitus (HCC) Currently taking Lipitor 20 mg daily, tolerating well without side effects.  As noted above, room for improvement in her dietary and exercise habits.  Checking lipid panel today.  Continue atorvastatin  as prescribed. - Lipid panel  4. Dysuria Reports that she has had some dysuria and notes of blood in her urine.  POCT urinalysis showing trace intact blood with 500 of glucose, no other abnormalities to indicate UTI.  We will send for culture to make sure but feel this is more a symptom of having excessive glucose in her urine.  Discussed the importance of protection for kidneys as well as management of urinary symptoms by reducing her blood sugars and getting her diabetes under control. - POCT URINALYSIS DIP (CLINITEK) - Urine Culture  Procedures performed this visit: None.  Return in about 6 months (around 06/04/2024) for HTN follow up.  __________________________________ Maryl Snook, DNP, APRN, FNP-BC Primary Care and Sports Medicine [facility]

## 2023-12-04 ENCOUNTER — Ambulatory Visit: Payer: Self-pay | Admitting: Medical-Surgical

## 2023-12-04 DIAGNOSIS — R748 Abnormal levels of other serum enzymes: Secondary | ICD-10-CM

## 2023-12-04 LAB — CMP14+EGFR
ALT: 33 IU/L — ABNORMAL HIGH (ref 0–32)
AST: 19 IU/L (ref 0–40)
Albumin: 4.4 g/dL (ref 3.8–4.9)
Alkaline Phosphatase: 143 IU/L — ABNORMAL HIGH (ref 44–121)
BUN/Creatinine Ratio: 18 (ref 9–23)
BUN: 9 mg/dL (ref 6–24)
Bilirubin Total: 0.4 mg/dL (ref 0.0–1.2)
CO2: 21 mmol/L (ref 20–29)
Calcium: 9.8 mg/dL (ref 8.7–10.2)
Chloride: 99 mmol/L (ref 96–106)
Creatinine, Ser: 0.49 mg/dL — ABNORMAL LOW (ref 0.57–1.00)
Globulin, Total: 2.9 g/dL (ref 1.5–4.5)
Glucose: 260 mg/dL — ABNORMAL HIGH (ref 70–99)
Potassium: 3.9 mmol/L (ref 3.5–5.2)
Sodium: 139 mmol/L (ref 134–144)
Total Protein: 7.3 g/dL (ref 6.0–8.5)
eGFR: 112 mL/min/{1.73_m2} (ref >59)

## 2023-12-04 LAB — LIPID PANEL
Chol/HDL Ratio: 2.7 ratio (ref 0.0–4.4)
Cholesterol, Total: 137 mg/dL (ref 100–199)
HDL: 50 mg/dL (ref >39)
LDL Chol Calc (NIH): 72 mg/dL (ref 0–99)
Triglycerides: 79 mg/dL (ref 0–149)
VLDL Cholesterol Cal: 15 mg/dL (ref 5–40)

## 2023-12-04 LAB — CBC WITH DIFFERENTIAL/PLATELET
Basophils Absolute: 0 10*3/uL (ref 0.0–0.2)
Basos: 1 %
EOS (ABSOLUTE): 0.1 10*3/uL (ref 0.0–0.4)
Eos: 2 %
Hematocrit: 45.2 % (ref 34.0–46.6)
Hemoglobin: 14.1 g/dL (ref 11.1–15.9)
Immature Grans (Abs): 0 10*3/uL (ref 0.0–0.1)
Immature Granulocytes: 0 %
Lymphocytes Absolute: 1.5 10*3/uL (ref 0.7–3.1)
Lymphs: 36 %
MCH: 25.5 pg — ABNORMAL LOW (ref 26.6–33.0)
MCHC: 31.2 g/dL — ABNORMAL LOW (ref 31.5–35.7)
MCV: 82 fL (ref 79–97)
Monocytes Absolute: 0.2 10*3/uL (ref 0.1–0.9)
Monocytes: 6 %
Neutrophils Absolute: 2.3 10*3/uL (ref 1.4–7.0)
Neutrophils: 55 %
Platelets: 226 10*3/uL (ref 150–450)
RBC: 5.53 x10E6/uL — ABNORMAL HIGH (ref 3.77–5.28)
RDW: 13.8 % (ref 11.7–15.4)
WBC: 4.1 10*3/uL (ref 3.4–10.8)

## 2023-12-07 LAB — URINE CULTURE

## 2023-12-08 MED ORDER — NITROFURANTOIN MONOHYD MACRO 100 MG PO CAPS: 100.0000 mg | ORAL_CAPSULE | Freq: Two times a day (BID) | ORAL | 0 refills | Status: DC

## 2023-12-09 ENCOUNTER — Other Ambulatory Visit: Payer: Self-pay | Admitting: Medical-Surgical

## 2023-12-12 ENCOUNTER — Other Ambulatory Visit: Payer: Self-pay | Admitting: Medical-Surgical

## 2023-12-17 ENCOUNTER — Other Ambulatory Visit: Payer: Self-pay | Admitting: Medical-Surgical

## 2023-12-19 ENCOUNTER — Ambulatory Visit: Admitting: Obstetrics & Gynecology

## 2023-12-24 ENCOUNTER — Ambulatory Visit: Admitting: Obstetrics and Gynecology

## 2023-12-24 ENCOUNTER — Other Ambulatory Visit (HOSPITAL_COMMUNITY)
Admission: RE | Admit: 2023-12-24 | Discharge: 2023-12-24 | Disposition: A | Discharge status: Home / Self Care | Source: Ambulatory Visit | Attending: Obstetrics and Gynecology | Admitting: Obstetrics and Gynecology

## 2023-12-24 VITALS — BP 153/81 | HR 81 | Ht 63.0 in | Wt 197.0 lb

## 2023-12-24 DIAGNOSIS — Z01419 Encounter for gynecological examination (general) (routine) without abnormal findings: Secondary | ICD-10-CM

## 2023-12-24 DIAGNOSIS — N841 Polyp of cervix uteri: Secondary | ICD-10-CM | POA: Diagnosis present

## 2023-12-24 DIAGNOSIS — N924 Excessive bleeding in the premenopausal period: Secondary | ICD-10-CM | POA: Diagnosis not present

## 2023-12-24 DIAGNOSIS — L309 Dermatitis, unspecified: Secondary | ICD-10-CM

## 2023-12-24 DIAGNOSIS — Z124 Encounter for screening for malignant neoplasm of cervix: Secondary | ICD-10-CM | POA: Diagnosis not present

## 2023-12-24 DIAGNOSIS — Z1231 Encounter for screening mammogram for malignant neoplasm of breast: Secondary | ICD-10-CM | POA: Diagnosis not present

## 2023-12-24 NOTE — Progress Notes (Signed)
 ANNUAL GYNECOLOGY VISIT Chief Complaint  Patient presents with   Gynecologic Exam     Subjective:  Ana Jennings is a 53 y.o. G1P0010 who presents for gyn exam.  She reports she has been having irregular spotting for the past 2-3 years. Only has light pink spotting for a day at a time. She will go for 3-8 months with no bleeding, once almost went a year without bleeding.  Also notes a history of eczema, psoriasis and sees a dermatologist for this. Has rash on her leges and has visit on 6/11 for this with derm. Notes itching/irritation of her vulva that has been bothering her for a while. No discharge.  Gyn History: Patient's last menstrual period was 12/24/2023 (exact date). Last pap:  Lab Results  Component Value Date   DIAGPAP  11/29/2020    - Negative for intraepithelial lesion or malignancy (NILM)   HPVHIGH Negative 11/29/2020  History of abnormal pap: No  Last mammogram: 2023 Last colonoscopy: 2022      12/24/2023    2:17 PM 12/03/2023    2:07 PM 10/09/2022    1:51 PM 11/22/2021   11:37 AM 04/26/2021    8:32 AM  Depression screen PHQ 2/9  Decreased Interest 0 0 0 1 0  Down, Depressed, Hopeless 0 0 0 1 0  PHQ - 2 Score 0 0 0 2 0  Altered sleeping 1   1   Tired, decreased energy 0   1   Change in appetite 0   1   Feeling bad or failure about yourself  0   0   Trouble concentrating 1   1   Moving slowly or fidgety/restless 0   0   Suicidal thoughts 0   0   PHQ-9 Score 2   6   Difficult doing work/chores    Not difficult at all         12/24/2023    2:17 PM 11/22/2021   11:38 AM 10/11/2020   11:27 AM  GAD 7 : Generalized Anxiety Score  Nervous, Anxious, on Edge 1 1 1   Control/stop worrying 0 0 1  Worry too much - different things 0 0 0  Trouble relaxing 0 1 0  Restless 0 0 0  Easily annoyed or irritable 0 0 0  Afraid - awful might happen 0 0 0  Total GAD 7 Score 1 2 2   Anxiety Difficulty  Not difficult at all Not difficult at all      OB History     Gravida   1   Para  0   Term  0   Preterm  0   AB  1   Living  0      SAB  1   IAB  0   Ectopic  0   Multiple  0   Live Births  0           Past Medical History:  Diagnosis Date   Diabetes mellitus without complication (HCC)    on meds   Hyperlipidemia    on meds   Hypertension    on meds    Past Surgical History:  Procedure Laterality Date   HYSTEROSCOPY WITH D & C     polyp removal x 2   REDUCTION MAMMAPLASTY Bilateral 2013   WISDOM TOOTH EXTRACTION      Social History   Socioeconomic History   Marital status: Married    Spouse name: Not on file   Number of  children: Not on file   Years of education: Not on file   Highest education level: Doctorate  Occupational History   Not on file  Tobacco Use   Smoking status: Never   Smokeless tobacco: Never  Vaping Use   Vaping status: Never Used  Substance and Sexual Activity   Alcohol use: Not Currently   Drug use: Never   Sexual activity: Yes    Birth control/protection: None  Other Topics Concern   Not on file  Social History Narrative   ** Merged History Encounter **       Social Drivers of Health   Financial Resource Strain: Low Risk  (05/28/2023)   Received from [facility]   Overall Financial Resource Strain (CARDIA)    Difficulty of Paying Living Expenses: Not hard at all  Food Insecurity: No Food Insecurity (05/28/2023)   Received from [facility]   Hunger Vital Sign    Worried About Running Out of Food in the Last Year: Never true    Ran Out of Food in the Last Year: Never true  Transportation Needs: No Transportation Needs (05/28/2023)   Received from [facility] - Transportation    Lack of Transportation (Medical): No    Lack of Transportation (Non-Medical): No  Physical Activity: Unknown (05/28/2023)   Received from [facility]   Exercise Vital Sign    Days of Exercise per Week: 3 days    Minutes of Exercise per Session: Not on file  Stress: No Stress Concern  Present (05/28/2023)   Received from [facility] - Occupational Stress Questionnaire    Feeling of Stress : Only a little  Social Connections: Moderately Integrated (05/28/2023)   Received from [facility]   Social Network    How would you rate your social network (family, work, friends)?: Adequate participation with social networks    Family History  Problem Relation Age of Onset   Diabetes Mother    COPD Mother    Diabetes Father    Cancer Father        pancreatic   Diabetes Maternal Grandmother    Diabetes Paternal Grandmother    Colon polyps Neg Hx    Colon cancer Neg Hx    Esophageal cancer Neg Hx    Rectal cancer Neg Hx    Stomach cancer Neg Hx     Current Outpatient Medications on File Prior to Visit  Medication Sig Dispense Refill   amLODipine  (NORVASC ) 5 MG tablet Take 1 tablet (5 mg total) by mouth daily. 30 tablet 0   atorvastatin  (LIPITOR) 20 MG tablet Take 1 tablet (20 mg total) by mouth daily. NEEDS APPOINTMENT FOR FURTHER REFILLS. 30 tablet 0   Blood Glucose Monitoring Suppl (ONE TOUCH ULTRA 2) w/Device KIT Dx DM. Check fasting blood sugar every morning and 2 hours after largest meal of the day a few days a week. 1 kit 0   clobetasol ointment (TEMOVATE) 0.05 % Apply 1 application topically 2 (two) times daily.     docusate sodium (COLACE) 100 MG capsule Take 100 mg by mouth 4 (four) times daily as needed for mild constipation.     empagliflozin  (JARDIANCE ) 25 MG TABS tablet Take 1 tablet (25 mg total) by mouth daily. 30 tablet 0   Fluocinolone  Acetonide Body 0.01 % OIL Apply topically.     Fluocinolone  Acetonide Scalp 0.01 % OIL Apply topically.     glipiZIDE  (GLUCOTROL ) 5 MG tablet TAKE 2  TABLETS BY MOUTH TWICE DAILY BEFORE A MEAL 360 tablet 0   glucose blood (ONETOUCH ULTRA) test strip Dx DM. Check fasting blood sugar every morning and 2 hours after largest meal of the day a few days a week. 100 each 12    hydrocortisone  2.5 % ointment Apply to affected area BID. 30 g 3   insulin  glargine, 1 Unit Dial, (TOUJEO  SOLOSTAR) 300 UNIT/ML Solostar Pen Inject 10 Units into the skin daily. Increase by 2 units twice weekly until fasting sugars are 90-120. Max dose 30 units daily. 7.5 mL 3   Insulin  Pen Needle 31G X 5 MM MISC Use to inject Toujeo  once daily. 100 each 3   ketoconazole (NIZORAL) 2 % shampoo Apply topically.     Lancets 30G MISC Dx DM. Check fasting blood sugar every morning and 2 hours after largest meal of the day a few days a week. 100 each PRN   nitrofurantoin , macrocrystal-monohydrate, (MACROBID ) 100 MG capsule Take 1 capsule (100 mg total) by mouth 2 (two) times daily. 10 capsule 0   triamcinolone  ointment (KENALOG ) 0.1 % Apply 1 application topically 2 (two) times daily. To affected areas 60 g 6   RYBELSUS  14 MG TABS Take 1 tablet by mouth once daily 90 tablet 0   tacrolimus  (PROTOPIC ) 0.1 % ointment Apply 1 Application topically 2 (two) times daily. (Patient not taking: Reported on 12/24/2023) 100 g 0   [DISCONTINUED] tacrolimus  (PROTOPIC ) 0.1 % ointment Apply 1 application. topically 2 (two) times daily. (Patient not taking: Reported on 02/20/2022)     No current facility-administered medications on file prior to visit.    Allergies  Allergen Reactions   Tapinarof Dermatitis     Objective:   Vitals:   12/24/23 1403 12/24/23 1412  BP: (!) 154/73 (!) 153/81  Pulse: 81 81  Weight: 197 lb 0.6 oz (89.4 kg)   Height: 5\' 3"  (1.6 m)    Physical Examination:   General appearance - well appearing, and in no distress  Mental status - alert, oriented to person, place, and time  Psych:  normal mood and affect  Skin - warm and dry, normal color, no suspicious lesions noted  Chest - effort normal  Heart - normal rate   Neck:  midline trachea, no thyromegaly or nodules  Breasts - breasts appear normal, no suspicious masses, no skin or nipple changes or  axillary nodes  Abdomen - soft,  nontender, nondistended, no masses or organomegaly  Pelvic -  VULVA: erythematous with some hypopigmentation, no excoriations or lesions, no masses   VAGINA: normal appearing vagina with normal color and discharge, no lesions   CERVIX: +cervical polyp noted, normal appearing cervix without discharge or lesions, no CMT  UTERUS: uterus is felt to be normal size, shape, consistency and nontender   ADNEXA: No adnexal masses or tenderness noted.  Extremities:  No swelling or varicosities noted  Chaperone present for exam  CERVICAL POLYP REMOVAL Risks of cervical polyp removal discussed including pain, bleeding were discussed. The patient stated understanding and agreed to undergo procedure today. Consent was signed, time out performed.   Polyp was grasped with ring forceps and twisted off at the base. No significant bleeding noted.  he patient tolerated the procedure well.   Post-procedure instructions were given to the patient.    Assessment and Plan:  1. Well woman exam with routine gynecological exam (Primary) Pap up to date Mammo ordered Colonoscopy up to date  2. Cervical cancer screening - Cytology - PAP  3. Abnormal perimenopausal bleeding Unclear if this is postmenopausal bleeding, bleeding due to cervical polyp of perimenopausal transition. Polyp removed as noted. Will get labs to assess hormonal status and pelvic ultrasound to evaluate endometrial stripe - Follicle stimulating hormone - Estradiol - US  PELVIC COMPLETE WITH TRANSVAGINAL; Future  4. Cervical polyp Uncomplicated polyp removal - Surgical pathology  5. Vulvar dermatitis Possible atopic dermatitis vs psoriasis vs lichen sclerosus. Recommend topical barriers/steroid ointments (which patient has at home) and return for vulvar biopsy. Patient verbalized understanding  6. Encounter for screening mammogram for malignant neoplasm of breast - MM 3D SCREENING MAMMOGRAM BILATERAL BREAST; Future   No follow-ups on  file.  Future Appointments  Date Time Provider Department Center  01/22/2024 10:00 AM [facility]  1 [facility]  01/22/2024 11:00 AM [facility]MM 1 [facility]  01/25/2024 10:35 AM Blair Mesina, Jodelle Mungo, MD [facility] None  06/02/2024  2:00 PM Cherre Cornish, NP [facility] None    Marci Setter, MD, FACOG Obstetrician & Gynecologist, [facility] for [facility], [facility]

## 2023-12-25 ENCOUNTER — Ambulatory Visit: Payer: Self-pay | Admitting: Obstetrics and Gynecology

## 2023-12-25 LAB — ESTRADIOL: Estradiol: 13.7 pg/mL

## 2023-12-26 LAB — SURGICAL PATHOLOGY

## 2024-01-01 LAB — SPECIMEN STATUS REPORT

## 2024-01-01 LAB — FOLLICLE STIMULATING HORMONE: FSH: 32 m[IU]/mL

## 2024-01-12 ENCOUNTER — Other Ambulatory Visit: Payer: Self-pay | Admitting: Medical-Surgical

## 2024-01-22 ENCOUNTER — Telehealth (HOSPITAL_BASED_OUTPATIENT_CLINIC_OR_DEPARTMENT_OTHER): Payer: Self-pay

## 2024-01-22 ENCOUNTER — Inpatient Hospital Stay (HOSPITAL_BASED_OUTPATIENT_CLINIC_OR_DEPARTMENT_OTHER): Admission: RE | Admit: 2024-01-22 | Source: Ambulatory Visit

## 2024-01-22 ENCOUNTER — Ambulatory Visit (HOSPITAL_BASED_OUTPATIENT_CLINIC_OR_DEPARTMENT_OTHER): Admission: RE | Admit: 2024-01-22 | Source: Ambulatory Visit

## 2024-01-23 ENCOUNTER — Telehealth: Payer: Self-pay

## 2024-01-23 NOTE — Telephone Encounter (Signed)
 Patient was identified as falling into the [place] Measure - Diabetes.   Patient was: Appointment already scheduled for:  November.   Patient treatment for DM is with [facility]

## 2024-01-25 ENCOUNTER — Ambulatory Visit: Admitting: Obstetrics and Gynecology

## 2024-01-25 VITALS — BP 130/82 | HR 88 | Wt 197.0 lb

## 2024-01-25 DIAGNOSIS — L309 Dermatitis, unspecified: Secondary | ICD-10-CM

## 2024-01-25 DIAGNOSIS — N951 Menopausal and female climacteric states: Secondary | ICD-10-CM | POA: Diagnosis not present

## 2024-01-25 DIAGNOSIS — N924 Excessive bleeding in the premenopausal period: Secondary | ICD-10-CM

## 2024-01-25 NOTE — Progress Notes (Signed)
   ESTABLISHED GYNECOLOGY VISIT Chief Complaint  Patient presents with   Follow-up    Subjective:  Ana Jennings is a 53 y.o. G1P0010 presenting for follow up.  Had initially been scheduled for vulvar biopsy. Reports she has seen her dermatologist who examined her and told her that everything was from psoriasis including her labial issues. She is starting an injection for this and was told it should clear up in 12 weeks.  She has not scheduled pelvic ultrasound yet.  Today complains of bothersome hot flashes and night sweats.   Review of Systems:   Pertinent items are noted in HPI  Pertinent History Reviewed:  Reviewed past medical,surgical, social and family history.  Reviewed problem list, medications and allergies.  Objective:   Vitals:   01/25/24 1055  BP: 130/82  Pulse: 88  Weight: 197 lb (89.4 kg)   Physical Examination:   General appearance - well appearing, and in no distress  Mental status - alert, oriented to person, place, and time    Assessment and Plan:  1. Vulvar dermatitis (Primary) Pt declines biopsy. Will return for biopsy if symptoms not improving with treatment by dermatologist  2. Abnormal perimenopausal bleeding Recommend pelvic ultrasound as previously advised. It is likely that her abnormal bleeding was from the cervical polyp but still need to exclude other abnormalities and examine endometrial stripe. Patient to schedule.  3. Menopausal hot flushes Will review in more detail after completion of pelvic ultrasound, consider menopausal hormone therapy   Future Appointments  Date Time Provider Department Center  01/31/2024  8:00 AM [facility] 1 [facility]  06/02/2024  2:00 PM Ana Zada, NP [facility] None    Rollo ONEIDA Bring, MD, FACOG Obstetrician & Gynecologist, [facility] for [facility], [facility]

## 2024-01-29 ENCOUNTER — Encounter (HOSPITAL_BASED_OUTPATIENT_CLINIC_OR_DEPARTMENT_OTHER): Payer: Self-pay

## 2024-01-29 ENCOUNTER — Ambulatory Visit (HOSPITAL_BASED_OUTPATIENT_CLINIC_OR_DEPARTMENT_OTHER): Admission: RE | Admit: 2024-01-29 | Source: Ambulatory Visit

## 2024-01-31 ENCOUNTER — Ambulatory Visit (HOSPITAL_BASED_OUTPATIENT_CLINIC_OR_DEPARTMENT_OTHER)
Admission: RE | Admit: 2024-01-31 | Discharge: 2024-01-31 | Disposition: A | Discharge status: Home / Self Care | Source: Ambulatory Visit | Attending: Obstetrics and Gynecology | Admitting: Obstetrics and Gynecology

## 2024-01-31 ENCOUNTER — Encounter (HOSPITAL_BASED_OUTPATIENT_CLINIC_OR_DEPARTMENT_OTHER): Payer: Self-pay

## 2024-01-31 DIAGNOSIS — Z1231 Encounter for screening mammogram for malignant neoplasm of breast: Secondary | ICD-10-CM | POA: Insufficient documentation

## 2024-02-05 ENCOUNTER — Other Ambulatory Visit: Payer: Self-pay | Admitting: Medical-Surgical

## 2024-02-05 MED ORDER — HYDROCORTISONE 2.5 % EX OINT: TOPICAL_OINTMENT | CUTANEOUS | 3 refills | Status: AC

## 2024-02-05 NOTE — Telephone Encounter (Unsigned)
 Copied from CRM [id number]. Topic: Clinical - Medication Refill >> Feb 05, 2024 11:10 AM Kevelyn M wrote: Medication: hydrocortisone  2.5 % ointment  Has the patient contacted their pharmacy? Yes (Agent: If no, request that the patient contact the pharmacy for the refill. If patient does not wish to contact the pharmacy document the reason why and proceed with request.) (Agent: If yes, when and what did the pharmacy advise?)  This is the patient's preferred pharmacy:  [facility] [address] [place], [place] - [address] [place] [address] Phone: [phone] Fax: [phone]  Is this the correct pharmacy for this prescription? Yes If no, delete pharmacy and type the correct one.   Has the prescription been filled recently? No  Is the patient out of the medication? Yes  Has the patient been seen for an appointment in the last year OR does the patient have an upcoming appointment? Yes  Can we respond through MyChart? Yes  Agent: Please be advised that Rx refills may take up to 3 business days. We ask that you follow-up with your pharmacy.

## 2024-02-07 ENCOUNTER — Ambulatory Visit (HOSPITAL_BASED_OUTPATIENT_CLINIC_OR_DEPARTMENT_OTHER)

## 2024-02-11 ENCOUNTER — Encounter (HOSPITAL_BASED_OUTPATIENT_CLINIC_OR_DEPARTMENT_OTHER): Payer: Self-pay

## 2024-02-11 ENCOUNTER — Ambulatory Visit (HOSPITAL_BASED_OUTPATIENT_CLINIC_OR_DEPARTMENT_OTHER)
Admission: RE | Admit: 2024-02-11 | Discharge: 2024-02-11 | Disposition: A | Discharge status: Home / Self Care | Source: Ambulatory Visit | Attending: Obstetrics and Gynecology | Admitting: Obstetrics and Gynecology

## 2024-02-11 DIAGNOSIS — N924 Excessive bleeding in the premenopausal period: Secondary | ICD-10-CM

## 2024-02-12 ENCOUNTER — Ambulatory Visit (HOSPITAL_BASED_OUTPATIENT_CLINIC_OR_DEPARTMENT_OTHER)
Admission: RE | Admit: 2024-02-12 | Discharge: 2024-02-12 | Disposition: A | Discharge status: Home / Self Care | Source: Ambulatory Visit | Attending: Obstetrics and Gynecology | Admitting: Obstetrics and Gynecology

## 2024-02-12 DIAGNOSIS — N924 Excessive bleeding in the premenopausal period: Secondary | ICD-10-CM | POA: Insufficient documentation

## 2024-02-19 ENCOUNTER — Other Ambulatory Visit: Payer: Self-pay | Admitting: Medical-Surgical

## 2024-02-22 ENCOUNTER — Other Ambulatory Visit (HOSPITAL_COMMUNITY)
Admission: RE | Admit: 2024-02-22 | Discharge: 2024-02-22 | Disposition: A | Discharge status: Home / Self Care | Source: Ambulatory Visit | Attending: Obstetrics and Gynecology | Admitting: Obstetrics and Gynecology

## 2024-02-22 ENCOUNTER — Ambulatory Visit: Admitting: Obstetrics and Gynecology

## 2024-02-22 VITALS — BP 135/78 | HR 90 | Ht 63.0 in | Wt 200.1 lb

## 2024-02-22 DIAGNOSIS — R9389 Abnormal findings on diagnostic imaging of other specified body structures: Secondary | ICD-10-CM | POA: Diagnosis present

## 2024-02-22 DIAGNOSIS — N950 Postmenopausal bleeding: Secondary | ICD-10-CM

## 2024-02-22 MED ORDER — IBUPROFEN 200 MG PO TABS
800.0000 mg | ORAL_TABLET | Freq: Once | ORAL | Status: AC
Start: 2024-02-22 — End: ?

## 2024-02-22 NOTE — Addendum Note (Signed)
 Addended by: JOMARIE SKIPPER D on: 02/22/2024 10:02 AM   Modules accepted: Orders

## 2024-02-22 NOTE — Progress Notes (Signed)
      GYNECOLOGY OFFICE PROCEDURE NOTE   Ana Jennings is a 53 y.o. G1P0010 here for endometrial biopsy for postmenopausal bleeding and thickened endometrium. Recent ultrasound also showed 8.7 mm endometrial stripe.      ENDOMETRIAL BIOPSY     The indications for endometrial biopsy were reviewed.   Risks of the biopsy including cramping, bleeding, infection, uterine perforation, inadequate specimen and need for additional procedures were discussed. Offered paracervical block which she declines. The patient states she understands the R/B/I/A and agrees to undergo procedure today. Urine pregnancy test was Not indicated. Consent was signed. Time out was performed.    Patient was positioned in dorsal lithotomy position. A vaginal speculum was placed.  The cervix was visualized and was prepped with Betadine.  A single-toothed tenaculum was placed on the anterior lip of the cervix to stabilize it. The 3 mm pipelle was easily introduced into the endometrial cavity without difficulty to a depth of 6 cm, and a Moderate amount of tissue was obtained after two passes and sent to pathology. The instruments were removed from the patient's vagina. Minimal bleeding from the cervix was noted. The patient tolerated the procedure well.   Patient was given post procedure instructions.  Will follow up pathology and manage accordingly; patient will be contacted with results and recommendations.      Rollo ONEIDA Bring, MD, FACOG Obstetrician & Gynecologist, [facility] for [facility], [facility]

## 2024-02-25 ENCOUNTER — Ambulatory Visit: Payer: Self-pay | Admitting: Obstetrics and Gynecology

## 2024-02-25 LAB — SURGICAL PATHOLOGY

## 2024-02-27 ENCOUNTER — Telehealth: Payer: Self-pay

## 2024-02-27 NOTE — Telephone Encounter (Signed)
 Patient was identified as falling into the [place] Measure - Diabetes.   Patient was: Appointment already scheduled for:  06/02/2024.   Patient treatment for DM is with [facility]

## 2024-03-12 ENCOUNTER — Telehealth: Payer: Self-pay

## 2024-03-12 NOTE — Telephone Encounter (Signed)
 Left message asking if she has had a recent diabetic eye exam. We need to know the location and name of the provider.

## 2024-04-12 ENCOUNTER — Other Ambulatory Visit: Payer: Self-pay | Admitting: Medical-Surgical

## 2024-05-01 ENCOUNTER — Other Ambulatory Visit: Payer: Self-pay | Admitting: Medical-Surgical

## 2024-05-01 NOTE — Telephone Encounter (Signed)
 Copied from CRM [id number]. Topic: Clinical - Medication Refill >> May 01, 2024  5:20 PM Winona R wrote: Medication: insulin  glargine, 1 Unit Dial, (TOUJEO  SOLOSTAR) 300 UNIT/ML Solostar Pen  Has the patient contacted their pharmacy? No (Agent: If no, request that the patient contact the pharmacy for the refill. If patient does not wish to contact the pharmacy document the reason why and proceed with request.) (Agent: If yes, when and what did the pharmacy advise?)  This is the patient's preferred pharmacy:  [facility] [address] [place], [place] - [address] [place] [address] Phone: [phone] Fax: [phone]  Is this the correct pharmacy for this prescription? Yes If no, delete pharmacy and type the correct one.   Has the prescription been filled recently? Yes  Is the patient out of the medication? Yes  Has the patient been seen for an appointment in the last year OR does the patient have an upcoming appointment? Yes  Can we respond through MyChart? Yes  Agent: Please be advised that Rx refills may take up to 3 business days. We ask that you follow-up with your pharmacy.

## 2024-05-02 MED ORDER — TOUJEO SOLOSTAR 300 UNIT/ML ~~LOC~~ SOPN: 10.0000 [IU] | PEN_INJECTOR | Freq: Every day | SUBCUTANEOUS | 0 refills | Status: AC

## 2024-05-17 ENCOUNTER — Other Ambulatory Visit: Payer: Self-pay | Admitting: Medical-Surgical

## 2024-05-29 ENCOUNTER — Encounter: Payer: Self-pay | Admitting: Medical-Surgical

## 2024-06-02 ENCOUNTER — Ambulatory Visit: Admitting: Medical-Surgical

## 2024-06-10 ENCOUNTER — Encounter: Payer: Self-pay | Admitting: Medical-Surgical

## 2024-06-10 ENCOUNTER — Ambulatory Visit: Admitting: Medical-Surgical

## 2024-06-10 VITALS — BP 143/83 | HR 79 | Resp 20 | Ht 63.0 in | Wt 198.1 lb

## 2024-06-10 DIAGNOSIS — E1159 Type 2 diabetes mellitus with other circulatory complications: Secondary | ICD-10-CM | POA: Diagnosis not present

## 2024-06-10 DIAGNOSIS — E1169 Type 2 diabetes mellitus with other specified complication: Secondary | ICD-10-CM | POA: Diagnosis not present

## 2024-06-10 DIAGNOSIS — E1165 Type 2 diabetes mellitus with hyperglycemia: Secondary | ICD-10-CM

## 2024-06-10 DIAGNOSIS — F418 Other specified anxiety disorders: Secondary | ICD-10-CM

## 2024-06-10 DIAGNOSIS — Z7984 Long term (current) use of oral hypoglycemic drugs: Secondary | ICD-10-CM

## 2024-06-10 DIAGNOSIS — I152 Hypertension secondary to endocrine disorders: Secondary | ICD-10-CM

## 2024-06-10 DIAGNOSIS — E785 Hyperlipidemia, unspecified: Secondary | ICD-10-CM

## 2024-06-10 LAB — POCT GLYCOSYLATED HEMOGLOBIN (HGB A1C)
HbA1c, POC (controlled diabetic range): 9.1 % — AB (ref 0.0–7.0)
Hemoglobin A1C: 9.1 % — AB (ref 4.0–5.6)

## 2024-06-10 MED ORDER — BUSPIRONE HCL 5 MG PO TABS: 5.0000 mg | ORAL_TABLET | Freq: Two times a day (BID) | ORAL | 0 refills | Status: AC

## 2024-06-10 MED ORDER — SERTRALINE HCL 50 MG PO TABS: ORAL_TABLET | ORAL | 3 refills | Status: AC

## 2024-06-10 NOTE — Progress Notes (Signed)
 Established patient visit   History of Present Illness   Discussed the use of AI scribe software for clinical note transcription with the patient, who gave verbal consent to proceed.  History of Present Illness   Ana Jennings is a 53 year old female with hypertension and anxiety who presents with increased anxiety and medication non-compliance.  Anxiety and psychological stress - Significant anxiety described as 'consuming' - Personal stressors include marital issues and recent death of a close classmate - No current engagement in counseling, but has contacted a potential counselor - Uses demanding job with long hours as a distraction, but finds it overwhelming at times  Hypertension - Has not taken blood pressure medication for the past week due to lack of refill and motivation - Blood pressure is slightly elevated - Attributes elevated blood pressure to stress, anxiety, and missed medication  Sleep disturbance and night sweats - Night sweats and disrupted sleep, possibly related to perimenopause - Sleep described as 'lucid,' with awareness of surroundings despite appearing asleep  Diabetes management - A1c improved from 9.4 to 9.1 since August - Finds endocrinologist supportive in managing diabetes     Physical Exam   Physical Exam Vitals reviewed.  Constitutional:      General: She is not in acute distress.    Appearance: Normal appearance. She is not ill-appearing.  HENT:     Head: Normocephalic and atraumatic.  Cardiovascular:     Rate and Rhythm: Normal rate and regular rhythm.     Pulses: Normal pulses.     Heart sounds: Normal heart sounds. No murmur heard.    No friction rub. No gallop.  Pulmonary:     Effort: Pulmonary effort is normal. No respiratory distress.     Breath sounds: Normal breath sounds. No wheezing.  Skin:    General: Skin is warm and dry.  Neurological:     Mental Status: She is alert and oriented to person, place, and time.   Psychiatric:        Attention and Perception: Attention normal.        Mood and Affect: Mood is anxious and depressed. Affect is blunt.        Speech: Speech normal.        Behavior: Behavior is slowed.        Thought Content: Thought content normal.        Cognition and Memory: Cognition normal.        Judgment: Judgment normal.    Assessment & Plan     Anxiety with depression Significant anxiety and depression exacerbated by stressors. Previously non-compliant with Zoloft . Open to medication. Discussed Zoloft 's benefits for anxiety and depression. Buspar  suggested for acute anxiety. - Prescribed Zoloft  25mg  daily for 1 week then increase to 50mg  daily. - Prescribed Buspar  5-10mg  BID for acute anxiety episodes. - Encouraged her to get started with counseling. - Scheduled follow-up in four weeks to assess medication response.  Type 2 diabetes mellitus with hyperglycemia, without long-term current use of insulin  A1c improved from 9.4% to 9.1%. Happy with current endocrinologist. - Continue current diabetes management plan with endocrinologist.  Hypertension associated with type 2 diabetes Elevated blood pressure due to medication non-adherence and stress. Non-compliance with medication for a week likely prompted by severe anxiety/depression symptoms. - Resume Amlodipine  5mg  daily. - Monitor blood pressure and reassess in follow-up visit.  Hyperlipidemia associated with type 2 diabetes Taking Lipitor 20mg  daily, noted medication non-adherence as above. UTD on lipid  checks. - Resume Lipitor as prescribed.  - Recheck labs in 6 months.     Follow up   Return in about 4 weeks (around 07/08/2024) for mood follow up. __________________________________ Zada FREDRIK Palin, DNP, APRN, FNP-BC Primary Care and Sports Medicine [facility] [place]

## 2024-06-10 NOTE — Progress Notes (Deleted)
   Established Patient Office Visit  Subjective   Patient ID: Ana Jennings, female    DOB: [birth year]  Age: 53 y.o. MRN: [id number]  No chief complaint on file.   HPI    ROS    Objective:     There were no vitals taken for this visit.   Physical Exam   No results found for any visits on 06/10/24.    The 10-year ASCVD risk score (Arnett DK, et al., 2019) is: 11.3%    Assessment & Plan:   Problem List Items Addressed This Visit   None   No follow-ups on file.    Derrek JINNY Freund, RN

## 2024-06-17 ENCOUNTER — Other Ambulatory Visit: Payer: Self-pay | Admitting: Medical-Surgical

## 2024-06-18 ENCOUNTER — Other Ambulatory Visit: Payer: Self-pay | Admitting: Medical-Surgical

## 2024-07-03 ENCOUNTER — Encounter: Payer: Self-pay | Admitting: Medical-Surgical

## 2024-07-03 ENCOUNTER — Ambulatory Visit: Admitting: Medical-Surgical

## 2024-07-03 VITALS — BP 140/83 | HR 80 | Resp 20 | Ht 63.0 in | Wt 196.4 lb

## 2024-07-03 DIAGNOSIS — F418 Other specified anxiety disorders: Secondary | ICD-10-CM | POA: Diagnosis not present

## 2024-07-03 NOTE — Progress Notes (Signed)
° °       Established patient visit  History, exam, impression, and plan:  1. Anxiety with depression (Primary) Very pleasant 53 year old female presenting today after being seen 4 weeks ago for anxiety depression.  She was prescribed sertraline  as well as BuSpar .  Today she reports that she has not taken the sertraline  yet and plans to start while she is on winter break from school.  She is taking BuSpar  5 mg in the a.m. and at night.  Reports that she is feeling much better than she was previously.  Not sure if it was because of our talk during her last appointment or the medications but she is happy with her current regimen.  She does still wake up around 3:00 in the morning.  She was awake at 3:30 AM this morning and had difficulty falling back asleep until around 4 AM.  Since our last appointment she has joined a church and reports that she thinks this is helpful as well.  Denies SI/HI.  Since her current regimen is working, plan to continue BuSpar  5 mg twice daily.  Start sertraline  as prescribed over the winter break and monitor for concerning side effects.  Monitor symptoms and if all goes well, plan to follow-up in 3-6 months for chronic disease management.  If having difficulty or worsening symptoms, return sooner.  Physical Exam Vitals reviewed.  Constitutional:      General: She is not in acute distress.    Appearance: Normal appearance.  HENT:     Head: Normocephalic and atraumatic.  Cardiovascular:     Rate and Rhythm: Normal rate and regular rhythm.     Pulses: Normal pulses.     Heart sounds: Normal heart sounds. No murmur heard.    No friction rub. No gallop.  Pulmonary:     Effort: Pulmonary effort is normal. No respiratory distress.     Breath sounds: Normal breath sounds. No wheezing.  Skin:    General: Skin is warm and dry.  Neurological:     Mental Status: She is alert and oriented to person, place, and time.  Psychiatric:        Mood and Affect: Mood normal.         Behavior: Behavior normal.        Thought Content: Thought content normal.        Judgment: Judgment normal.    Procedures performed this visit: None.  Return for chronic disease follow up in 3-6 months.  __________________________________ Zada FREDRIK Palin, DNP, APRN, FNP-BC Primary Care and Sports Medicine [facility] [place]

## 2024-07-03 NOTE — Progress Notes (Deleted)
° °  Established Patient Office Visit  Subjective   Patient ID: Ana Jennings, female    DOB: 03-06-1971  Age: 53 y.o. MRN: [id number]  No chief complaint on file.   HPI  53 year old female presents for follow up on anxiety and depression  Patient is currently taking Buspar  5mg  and Sertraline  50 mg daily. Patient reports doing well on her current regimen  ROS    Objective:     There were no vitals taken for this visit. {Vitals History (Optional):23777}  Physical Exam   No results found for any visits on 07/03/24.  {Labs (Optional):23779}  The 10-year ASCVD risk score (Arnett DK, et al., 2019) is: 7.6%    Assessment & Plan:   Problem List Items Addressed This Visit   None Visit Diagnoses       Anxiety with depression    -  Primary       No follow-ups on file.    Derrek JINNY Freund, RN

## 2024-07-23 ENCOUNTER — Other Ambulatory Visit: Payer: Self-pay | Admitting: Medical-Surgical

## 2024-08-09 ENCOUNTER — Other Ambulatory Visit: Payer: Self-pay | Admitting: Medical-Surgical
